# Patient Record
Sex: Male | Born: 1965 | Race: Black or African American | Hispanic: No | Marital: Single | Smoking: Never smoker
Health system: Southern US, Community
[De-identification: ages and names within clinical notes are randomized; demographics above are authoritative.]

## PROBLEM LIST (undated history)

## (undated) DIAGNOSIS — I10 Essential (primary) hypertension: Secondary | ICD-10-CM

## (undated) HISTORY — DX: Essential (primary) hypertension: I10

## (undated) HISTORY — PX: NO PAST SURGERIES: SHX2092

---

## 1998-01-25 ENCOUNTER — Emergency Department (HOSPITAL_COMMUNITY): Admission: EM | Admit: 1998-01-25 | Discharge: 1998-01-25 | Payer: Self-pay | Admitting: Emergency Medicine

## 1998-12-14 ENCOUNTER — Emergency Department (HOSPITAL_COMMUNITY): Admission: EM | Admit: 1998-12-14 | Discharge: 1998-12-14 | Payer: Self-pay | Admitting: Emergency Medicine

## 2002-09-02 ENCOUNTER — Encounter: Payer: Self-pay | Admitting: Occupational Medicine

## 2002-09-02 ENCOUNTER — Encounter: Admission: RE | Admit: 2002-09-02 | Discharge: 2002-09-02 | Payer: Self-pay | Admitting: Occupational Medicine

## 2006-03-31 ENCOUNTER — Ambulatory Visit: Payer: Self-pay | Admitting: Family Medicine

## 2010-09-02 ENCOUNTER — Ambulatory Visit (INDEPENDENT_AMBULATORY_CARE_PROVIDER_SITE_OTHER): Payer: Managed Care, Other (non HMO) | Admitting: Family Medicine

## 2010-09-02 DIAGNOSIS — L723 Sebaceous cyst: Secondary | ICD-10-CM

## 2011-02-20 ENCOUNTER — Emergency Department (HOSPITAL_COMMUNITY)
Admission: EM | Admit: 2011-02-20 | Discharge: 2011-02-20 | Disposition: A | Discharge status: Home / Self Care | Payer: Managed Care, Other (non HMO) | Attending: Emergency Medicine | Admitting: Emergency Medicine

## 2011-02-20 DIAGNOSIS — H6690 Otitis media, unspecified, unspecified ear: Secondary | ICD-10-CM | POA: Insufficient documentation

## 2011-02-20 DIAGNOSIS — N390 Urinary tract infection, site not specified: Secondary | ICD-10-CM | POA: Insufficient documentation

## 2011-02-20 DIAGNOSIS — A64 Unspecified sexually transmitted disease: Secondary | ICD-10-CM | POA: Insufficient documentation

## 2011-02-20 LAB — URINALYSIS, ROUTINE W REFLEX MICROSCOPIC
Bilirubin Urine: NEGATIVE
Glucose, UA: NEGATIVE mg/dL
Hgb urine dipstick: NEGATIVE
Nitrite: NEGATIVE
Specific Gravity, Urine: 1.025 (ref 1.005–1.030)
Urobilinogen, UA: 1 mg/dL (ref 0.0–1.0)
pH: 6 (ref 5.0–8.0)

## 2011-02-20 LAB — URINE MICROSCOPIC-ADD ON

## 2011-02-21 LAB — URINE CULTURE
Culture  Setup Time: 201209161115
Culture: NO GROWTH

## 2011-02-22 LAB — GC/CHLAMYDIA PROBE AMP, GENITAL: GC Probe Amp, Genital: NEGATIVE

## 2013-08-16 ENCOUNTER — Ambulatory Visit: Payer: No Typology Code available for payment source

## 2013-08-16 ENCOUNTER — Ambulatory Visit (INDEPENDENT_AMBULATORY_CARE_PROVIDER_SITE_OTHER): Payer: No Typology Code available for payment source | Admitting: Emergency Medicine

## 2013-08-16 ENCOUNTER — Other Ambulatory Visit: Payer: Self-pay | Admitting: Emergency Medicine

## 2013-08-16 VITALS — BP 146/92 | HR 110 | Temp 98.4°F | Resp 18 | Ht 66.5 in | Wt 141.6 lb

## 2013-08-16 DIAGNOSIS — M25569 Pain in unspecified knee: Secondary | ICD-10-CM

## 2013-08-16 LAB — POCT CBC
Granulocyte percent: 72.5 %G (ref 37–80)
HCT, POC: 43.2 % — AB (ref 43.5–53.7)
HEMOGLOBIN: 13.7 g/dL — AB (ref 14.1–18.1)
LYMPH, POC: 2 (ref 0.6–3.4)
MCH: 30.4 pg (ref 27–31.2)
MCHC: 31.7 g/dL — AB (ref 31.8–35.4)
MCV: 96 fL (ref 80–97)
MID (CBC): 0.7 (ref 0–0.9)
MPV: 7.4 fL (ref 0–99.8)
PLATELET COUNT, POC: 386 10*3/uL (ref 142–424)
POC GRANULOCYTE: 7.2 — AB (ref 2–6.9)
POC LYMPH %: 20.2 % (ref 10–50)
POC MID %: 7.3 % (ref 0–12)
RBC: 4.5 M/uL — AB (ref 4.69–6.13)
RDW, POC: 13 %
WBC: 9.9 10*3/uL (ref 4.6–10.2)

## 2013-08-16 MED ORDER — HYDROCODONE-ACETAMINOPHEN 5-325 MG PO TABS: 1.0000 | ORAL_TABLET | Freq: Four times a day (QID) | ORAL | Status: DC | PRN

## 2013-08-16 MED ORDER — COLCHICINE 0.6 MG PO TABS: 0.6000 mg | ORAL_TABLET | Freq: Two times a day (BID) | ORAL | Status: DC

## 2013-08-16 NOTE — Patient Instructions (Signed)
Gout Gout is an inflammatory arthritis caused by a buildup of uric acid crystals in the joints. Uric acid is a chemical that is normally present in the blood. When the level of uric acid in the blood is too high it can form crystals that deposit in your joints and tissues. This causes joint redness, soreness, and swelling (inflammation). Repeat attacks are common. Over time, uric acid crystals can form into masses (tophi) near a joint, destroying bone and causing disfigurement. Gout is treatable and often preventable. CAUSES  The disease begins with elevated levels of uric acid in the blood. Uric acid is produced by your body when it breaks down a naturally found substance called purines. Certain foods you eat, such as meats and fish, contain high amounts of purines. Causes of an elevated uric acid level include:  Being passed down from parent to child (heredity).  Diseases that cause increased uric acid production (such as obesity, psoriasis, and certain cancers).  Excessive alcohol use.  Diet, especially diets rich in meat and seafood.  Medicines, including certain cancer-fighting medicines (chemotherapy), water pills (diuretics), and aspirin.  Chronic kidney disease. The kidneys are no longer able to remove uric acid well.  Problems with metabolism. Conditions strongly associated with gout include:  Obesity.  High blood pressure.  High cholesterol.  Diabetes. Not everyone with elevated uric acid levels gets gout. It is not understood why some people get gout and others do not. Surgery, joint injury, and eating too much of certain foods are some of the factors that can lead to gout attacks. SYMPTOMS   An attack of gout comes on quickly. It causes intense pain with redness, swelling, and warmth in a joint.  Fever can occur.  Often, only one joint is involved. Certain joints are more commonly involved:  Base of the big toe.  Knee.  Ankle.  Wrist.  Finger. Without  treatment, an attack usually goes away in a few days to weeks. Between attacks, you usually will not have symptoms, which is different from many other forms of arthritis. DIAGNOSIS  Your caregiver will suspect gout based on your symptoms and exam. In some cases, tests may be recommended. The tests may include:  Blood tests.  Urine tests.  X-rays.  Joint fluid exam. This exam requires a needle to remove fluid from the joint (arthrocentesis). Using a microscope, gout is confirmed when uric acid crystals are seen in the joint fluid. TREATMENT  There are two phases to gout treatment: treating the sudden onset (acute) attack and preventing attacks (prophylaxis).  Treatment of an Acute Attack.  Medicines are used. These include anti-inflammatory medicines or steroid medicines.  An injection of steroid medicine into the affected joint is sometimes necessary.  The painful joint is rested. Movement can worsen the arthritis.  You may use warm or cold treatments on painful joints, depending which works best for you.  Treatment to Prevent Attacks.  If you suffer from frequent gout attacks, your caregiver may advise preventive medicine. These medicines are started after the acute attack subsides. These medicines either help your kidneys eliminate uric acid from your body or decrease your uric acid production. You may need to stay on these medicines for a very long time.  The early phase of treatment with preventive medicine can be associated with an increase in acute gout attacks. For this reason, during the first few months of treatment, your caregiver may also advise you to take medicines usually used for acute gout treatment. Be sure you   understand your caregiver's directions. Your caregiver may make several adjustments to your medicine dose before these medicines are effective.  Discuss dietary treatment with your caregiver or dietitian. Alcohol and drinks high in sugar and fructose and foods  such as meat, poultry, and seafood can increase uric acid levels. Your caregiver or dietician can advise you on drinks and foods that should be limited. HOME CARE INSTRUCTIONS   Do not take aspirin to relieve pain. This raises uric acid levels.  Only take over-the-counter or prescription medicines for pain, discomfort, or fever as directed by your caregiver.  Rest the joint as much as possible. When in bed, keep sheets and blankets off painful areas.  Keep the affected joint raised (elevated).  Apply warm or cold treatments to painful joints. Use of warm or cold treatments depends on which works best for you.  Use crutches if the painful joint is in your leg.  Drink enough fluids to keep your urine clear or pale yellow. This helps your body get rid of uric acid. Limit alcohol, sugary drinks, and fructose drinks.  Follow your dietary instructions. Pay careful attention to the amount of protein you eat. Your daily diet should emphasize fruits, vegetables, whole grains, and fat-free or low-fat milk products. Discuss the use of coffee, vitamin C, and cherries with your caregiver or dietician. These may be helpful in lowering uric acid levels.  Maintain a healthy body weight. SEEK MEDICAL CARE IF:   You develop diarrhea, vomiting, or any side effects from medicines.  You do not feel better in 24 hours, or you are getting worse. SEEK IMMEDIATE MEDICAL CARE IF:   Your joint becomes suddenly more tender, and you have chills or a fever. MAKE SURE YOU:   Understand these instructions.  Will watch your condition.  Will get help right away if you are not doing well or get worse. Document Released: 05/20/2000 Document Revised: 09/17/2012 Document Reviewed: 01/04/2012 [facility]ExitCare Patient Information 2014 [place]ExitCare, [place]. Hypertension As your heart beats, it forces blood through your arteries. This force is your blood pressure. If the pressure is too high, it is called hypertension (HTN) or high  blood pressure. HTN is dangerous because you may have it and not know it. High blood pressure may mean that your heart has to work harder to pump blood. Your arteries may be narrow or stiff. The extra work puts you at risk for heart disease, stroke, and other problems.  Blood pressure consists of two numbers, a higher number over a lower, 110/72, for example. It is stated as "110 over 72." The ideal is below 120 for the top number (systolic) and under 80 for the bottom (diastolic). Write down your blood pressure today. You should pay close attention to your blood pressure if you have certain conditions such as:  Heart failure.  Prior heart attack.  Diabetes  Chronic kidney disease.  Prior stroke.  Multiple risk factors for heart disease. To see if you have HTN, your blood pressure should be measured while you are seated with your arm held at the level of the heart. It should be measured at least twice. A one-time elevated blood pressure reading (especially in the Emergency Department) does not mean that you need treatment. There may be conditions in which the blood pressure is different between your right and left arms. It is important to see your caregiver soon for a recheck. Most people have essential hypertension which means that there is not a specific cause. This type of high  blood pressure may be lowered by changing lifestyle factors such as:  Stress.  Smoking.  Lack of exercise.  Excessive weight.  Drug/tobacco/alcohol use.  Eating less salt. Most people do not have symptoms from high blood pressure until it has caused damage to the body. Effective treatment can often prevent, delay or reduce that damage. TREATMENT  When a cause has been identified, treatment for high blood pressure is directed at the cause. There are a large number of medications to treat HTN. These fall into several categories, and your caregiver will help you select the medicines that are best for you.  Medications may have side effects. You should review side effects with your caregiver. If your blood pressure stays high after you have made lifestyle changes or started on medicines,   Your medication(s) may need to be changed.  Other problems may need to be addressed.  Be certain you understand your prescriptions, and know how and when to take your medicine.  Be sure to follow up with your caregiver within the time frame advised (usually within two weeks) to have your blood pressure rechecked and to review your medications.  If you are taking more than one medicine to lower your blood pressure, make sure you know how and at what times they should be taken. Taking two medicines at the same time can result in blood pressure that is too low. SEEK IMMEDIATE MEDICAL CARE IF:  You develop a severe headache, blurred or changing vision, or confusion.  You have unusual weakness or numbness, or a faint feeling.  You have severe chest or abdominal pain, vomiting, or breathing problems. MAKE SURE YOU:   Understand these instructions.  Will watch your condition.  Will get help right away if you are not doing well or get worse. Document Released: 05/23/2005 Document Revised: 08/15/2011 Document Reviewed: 01/11/2008 [facility]Exit[facility] Patient Information 2014 [place], [place].

## 2013-08-16 NOTE — Progress Notes (Addendum)
Subjective:    Patient ID: Juan Burton, male    DOB: December 29, 1965, 48 y.o.   MRN: [id number]  HPI Scribed for Lesle ChrisSteven Daub MD, the patient was seen in room 1. This chart was scribed by Lewanda RifeAlexandra Hurtado, ED scribe. Patient's care was started at 5:50 PM  HPI Comments: Hx was provided by the pt.  Juan Burton is a 48 y.o. male who presents to the [facility] complaining of constant right knee "soreness" onset gradual 7 days. States he is a [employer]. Describes pain as moderate in severity. Reports pain is exacerbated by ROM and mildly alleviated at rest. Denies associated other arthralgias, recent trauma/fall, chills, and fever. Denies PMHx of gout.   History reviewed. No pertinent past medical history.  History reviewed. No pertinent past surgical history.  Family History  Problem Relation Age of Onset   Diabetes Mother    Hypertension Mother     History   Social History   Marital Status: Single    Spouse Name: N/A    Number of Children: N/A   Years of Education: N/A   Occupational History   Not on file.   Social History Main Topics   Smoking status: Never Smoker    Smokeless tobacco: Not on file   Alcohol Use: No   Drug Use: No   Sexual Activity: Not on file   Other Topics Concern   Not on file   Social History Narrative   No narrative on file    No Known Allergies  There are no active problems to display for this patient.   Filed Vitals:   08/16/13 1652  BP: 146/92  Pulse: 110  Temp: 98.4 F (36.9 C)  TempSrc: Oral  Resp: 18  Height: 5' 6.5" (1.689 m)  Weight: 141 lb 9.6 oz (64.229 kg)  SpO2: 98%         Review of Systems A complete 10 system review of systems was obtained and all systems are negative except as noted in the HPI and PMHx.       Objective:   Physical Exam  Physical Exam  Vitals reviewed. Constitutional: He is oriented to person, place, and time. He appears well-developed and well-nourished.  No distress.  HENT:  Head: Normocephalic and atraumatic.  Throat: Oropharynx is clear and moist. Mucous membranes normal. Uvula is midline.  Eyes: EOM are normal.  Neck: Neck supple. No tracheal deviation present.  Cardiovascular: Normal rate.   Pulmonary/Chest: Effort normal. No respiratory distress.  Musculoskeletal:  Right knee effusion noted. Limited ROM of right knee secondary to fluid.  No instability of right knee. Not able to extend knee. Knee was warm to the touch.  Neurological: He is alert and oriented to person, place, and time.  Skin: Skin is warm and dry.  Psychiatric: He has a normal mood and affect. His behavior is normal.  The right knee was prepped with Betadine x2. The skin was numbed  with 1-1/2 cc of 2% plain. Using sterile gloves approximately 30 cc of a slightly cloudy yellow fluid was removed. Patient tolerated the procedure well. Results for orders placed in visit on 08/16/13  POCT CBC      Result Value Ref Range   WBC 9.9  4.6 - 10.2 K/uL   Lymph, poc 2.0  0.6 - 3.4   POC LYMPH PERCENT 20.2  10 - 50 %L   MID (cbc) 0.7  0 - 0.9   POC MID % 7.3  0 - 12 %  M   POC Granulocyte 7.2 (*) 2 - 6.9   Granulocyte percent 72.5  37 - 80 %G   RBC 4.50 (*) 4.69 - 6.13 M/uL   Hemoglobin 13.7 (*) 14.1 - 18.1 g/dL   HCT, POC 16.1 (*) 09.6 - 53.7 %   MCV 96.0  80 - 97 fL   MCH, POC 30.4  27 - 31.2 pg   MCHC 31.7 (*) 31.8 - 35.4 g/dL   RDW, POC 04.5     Platelet Count, POC 386  142 - 424 K/uL   MPV 7.4  0 - 99.8 fL   [facility] reading (PRIMARY) by  Dr Cleta Alberts no abnormalities seen on the films.    Assessment & Plan:  I suspect this is gout. We'll treat with colchicine 0.6 twice a day and he was given hydrocodone for severe pain along with crutches . Patient did have chlamydia 2 years ago. I did go ahead and do a URiprobe and sent his joint fluid for culture. I still suspect this is most likely gout. We'll get him on colchicine and see how that does recheck him on Wednesday sooner if  he is getting worse. I gave him information about blood pressure and gout. If his pressure still elevated on his recheck will start him on blood pressure medication . I personally performed the services described in this documentation, which was scribed in my presence. The recorded information has been reviewed and is accurate. I personally performed the services described in this documentation, which was scribed in my presence. The recorded information has been reviewed and is accurate.

## 2013-08-17 LAB — BASIC METABOLIC PANEL
BUN: 15 mg/dL (ref 6–23)
CO2: 31 meq/L (ref 19–32)
Calcium: 9.6 mg/dL (ref 8.4–10.5)
Chloride: 102 mEq/L (ref 96–112)
Creat: 1.12 mg/dL (ref 0.50–1.35)
GLUCOSE: 81 mg/dL (ref 70–99)
POTASSIUM: 4.3 meq/L (ref 3.5–5.3)
Sodium: 140 mEq/L (ref 135–145)

## 2013-08-17 LAB — URIC ACID: URIC ACID, SERUM: 6 mg/dL (ref 4.0–7.8)

## 2013-08-20 ENCOUNTER — Encounter: Payer: Self-pay | Admitting: *Deleted

## 2013-08-20 LAB — SYNOVIAL FLUID PANEL
Crystals, Fluid: NONE SEEN
EOSINOPHILS-SYNOVIAL: 0 % (ref 0–1)
Glucose, Synovial Fluid: 51 mg/dL
LYMPHOCYTES-SYNOVIAL FLD: 41 % — AB (ref 0–20)
MONOCYTE/MACROPHAGE: 14 % — AB (ref 50–90)
Neutrophil, Synovial: 45 % — ABNORMAL HIGH (ref 0–25)
Protein, Synovial Fluid: 4.7 g/dL — ABNORMAL HIGH (ref 1.0–3.0)
WBC, SYNOVIAL: 5600 uL — AB (ref 0–200)

## 2013-08-20 LAB — BODY FLUID CULTURE
Gram Stain: NONE SEEN
Organism ID, Bacteria: NO GROWTH

## 2013-08-21 ENCOUNTER — Telehealth: Payer: Self-pay

## 2013-08-21 LAB — BODY FLUID CULTURE
Gram Stain: NONE SEEN
Organism ID, Bacteria: NO GROWTH

## 2013-08-21 NOTE — Telephone Encounter (Signed)
DR.DAUB, PT STATES THAT YOU WERE SUPPOSED TO CALL HER REGARDING HER KNEE. BEST# [phone]

## 2013-08-21 NOTE — Telephone Encounter (Signed)
Lab spoke to pt.

## 2013-08-22 ENCOUNTER — Ambulatory Visit (INDEPENDENT_AMBULATORY_CARE_PROVIDER_SITE_OTHER): Payer: No Typology Code available for payment source | Admitting: Emergency Medicine

## 2013-08-22 VITALS — BP 177/99 | HR 99 | Temp 98.2°F | Resp 18 | Ht 66.5 in | Wt 144.0 lb

## 2013-08-22 DIAGNOSIS — M25561 Pain in right knee: Secondary | ICD-10-CM

## 2013-08-22 DIAGNOSIS — I10 Essential (primary) hypertension: Secondary | ICD-10-CM

## 2013-08-22 DIAGNOSIS — M25569 Pain in unspecified knee: Secondary | ICD-10-CM

## 2013-08-22 LAB — GC/CHLAMYDIA PROBE AMP
CT Probe RNA: POSITIVE — AB
GC Probe RNA: NEGATIVE

## 2013-08-22 LAB — HIV ANTIBODY (ROUTINE TESTING W REFLEX): HIV: NONREACTIVE

## 2013-08-22 LAB — RPR

## 2013-08-22 MED ORDER — AZITHROMYCIN 250 MG PO TABS: ORAL_TABLET | ORAL | Status: DC

## 2013-08-22 MED ORDER — METOPROLOL SUCCINATE ER 50 MG PO TB24: ORAL_TABLET | ORAL | Status: DC

## 2013-08-22 NOTE — Patient Instructions (Signed)
Please return to see me in 2 weeks. Take your blood pressure medication as instructed. I am making you an appointment to see the knee specialist .Hypertension As your heart beats, it forces blood through your arteries. This force is your blood pressure. If the pressure is too high, it is called hypertension (HTN) or high blood pressure. HTN is dangerous because you may have it and not know it. High blood pressure may mean that your heart has to work harder to pump blood. Your arteries may be narrow or stiff. The extra work puts you at risk for heart disease, stroke, and other problems.  Blood pressure consists of two numbers, a higher number over a lower, 110/72, for example. It is stated as "110 over 72." The ideal is below 120 for the top number (systolic) and under 80 for the bottom (diastolic). Write down your blood pressure today. You should pay close attention to your blood pressure if you have certain conditions such as:  Heart failure.  Prior heart attack.  Diabetes  Chronic kidney disease.  Prior stroke.  Multiple risk factors for heart disease. To see if you have HTN, your blood pressure should be measured while you are seated with your arm held at the level of the heart. It should be measured at least twice. A one-time elevated blood pressure reading (especially in the Emergency Department) does not mean that you need treatment. There may be conditions in which the blood pressure is different between your right and left arms. It is important to see your caregiver soon for a recheck. Most people have essential hypertension which means that there is not a specific cause. This type of high blood pressure may be lowered by changing lifestyle factors such as:  Stress.  Smoking.  Lack of exercise.  Excessive weight.  Drug/tobacco/alcohol use.  Eating less salt. Most people do not have symptoms from high blood pressure until it has caused damage to the body. Effective treatment can  often prevent, delay or reduce that damage. TREATMENT  When a cause has been identified, treatment for high blood pressure is directed at the cause. There are a large number of medications to treat HTN. These fall into several categories, and your caregiver will help you select the medicines that are best for you. Medications may have side effects. You should review side effects with your caregiver. If your blood pressure stays high after you have made lifestyle changes or started on medicines,   Your medication(s) may need to be changed.  Other problems may need to be addressed.  Be certain you understand your prescriptions, and know how and when to take your medicine.  Be sure to follow up with your caregiver within the time frame advised (usually within two weeks) to have your blood pressure rechecked and to review your medications.  If you are taking more than one medicine to lower your blood pressure, make sure you know how and at what times they should be taken. Taking two medicines at the same time can result in blood pressure that is too low. SEEK IMMEDIATE MEDICAL CARE IF:  You develop a severe headache, blurred or changing vision, or confusion.  You have unusual weakness or numbness, or a faint feeling.  You have severe chest or abdominal pain, vomiting, or breathing problems. MAKE SURE YOU:   Understand these instructions.  Will watch your condition.  Will get help right away if you are not doing well or get worse. Document Released: 05/23/2005 Document Revised:  08/15/2011 Document Reviewed: 01/11/2008 ExitCare Patient Information 2014 [place], [place].

## 2013-08-22 NOTE — Progress Notes (Signed)
Subjective:    Patient ID: Juan Burton, male    DOB: 09/03/1965, 48 y.o.   MRN: [id number]  HPI 48 year old male pt presents for a follow up on the right knee. Pt states the medication is helping. No injury to knee. His knee has never given away or become locked within the last month. He previously had a lot of inflammation in his knee. The cultures did not show any bacteria or crystals. Pt does custodial work and walks a lot. Pt's blood pressure was high last time and also high today. Pt has never been on BP medication. When he came in today his BP was 152/80; when it was rechecked it was 177/99.   Review of Systems     Objective:   Physical Exam patient is alert and cooperative not ill-appearing. His neck is supple. Chest was clear heart was an S4 gallop without S3 no murmurs abdomen is soft nontender examination of the right knee reveals persistent 2+ swelling. He is unable to reach full extension most likely secondary to the fluid on his knee. There is no joint instability there is no joint line tenderness .  Results for orders placed in visit on 08/16/13  BODY FLUID CULTURE      Result Value Ref Range   Gram Stain WBC present-predominately PMN     Gram Stain No Organisms Seen     Organism ID, Bacteria NO GROWTH 3 DAYS    BODY FLUID CULTURE      Result Value Ref Range   Gram Stain WBC present-both PMN and Mononuclear     Gram Stain No Organisms Seen     Organism ID, Bacteria NO GROWTH 3 DAYS    SYNOVIAL FLUID PANEL      Result Value Ref Range   Protein, Synovial Fluid 4.7 (*) 1.0 - 3.0 g/dL   Color, Synovial YELLOW  YELLOW   Appearance-Synovial HAZY (*) CLEAR   WBC, Synovial 5600 (*) 0 - 200 cu mm   Neutrophil, Synovial 45 (*) 0 - 25 %   Lymphocytes-Synovial Fld 41 (*) 0 - 20 %   Monocyte/Macrophage 14 (*) 50 - 90 %   Eosinophils-Synovial 0  0 - 1 %   Crystals, Fluid No crystals seen     Glucose, Synovial Fluid 51    BASIC METABOLIC PANEL      Result Value Ref Range   Sodium 140  135 - 145 mEq/L   Potassium 4.3  3.5 - 5.3 mEq/L   Chloride 102  96 - 112 mEq/L   CO2 31  19 - 32 mEq/L   Glucose, Bld 81  70 - 99 mg/dL   BUN 15  6 - 23 mg/dL   Creat 9.141.12  7.820.50 - 9.561.35 mg/dL   Calcium 9.6  8.4 - 21.310.5 mg/dL  URIC ACID      Result Value Ref Range   Uric Acid, Serum 6.0  4.0 - 7.8 mg/dL  POCT CBC      Result Value Ref Range   WBC 9.9  4.6 - 10.2 K/uL   Lymph, poc 2.0  0.6 - 3.4   POC LYMPH PERCENT 20.2  10 - 50 %L   MID (cbc) 0.7  0 - 0.9   POC MID % 7.3  0 - 12 %M   POC Granulocyte 7.2 (*) 2 - 6.9   Granulocyte percent 72.5  37 - 80 %G   RBC 4.50 (*) 4.69 - 6.13 M/uL   Hemoglobin 13.7 (*)  14.1 - 18.1 g/dL   HCT, POC 78.4 (*) 69.6 - 53.7 %   MCV 96.0  80 - 97 fL   MCH, POC 30.4  27 - 31.2 pg   MCHC 31.7 (*) 31.8 - 35.4 g/dL   RDW, POC 29.5     Platelet Count, POC 386  142 - 424 K/uL   MPV 7.4  0 - 99.8 fL   EKG borderline short PR interval      Assessment & Plan:  Not sure of the cause of his right knee swelling. It appears to be some type of inflammatory condition. Culture was negative. No crystals were seen. He has improved on colchicine. Referral made to orthopedics. He is to transition to Aleve 2 twice a day. He can return to work Monday. He was started on Lopressor 25 mg twice a day for blood pressure control recheck 1 month. His EKG did have a borderline short PR interval but he has no symptoms of tachycardia . There was no delta wave

## 2013-08-22 NOTE — Addendum Note (Signed)
Addended by: Johnnette LitterARDWELL, Marita Burnsed M on: 08/22/2013 02:44 PM   Modules accepted: Orders

## 2013-08-23 ENCOUNTER — Telehealth: Payer: Self-pay

## 2013-08-23 ENCOUNTER — Other Ambulatory Visit: Payer: Self-pay

## 2013-08-23 MED ORDER — AZITHROMYCIN 1 G PO PACK: 1.0000 g | PACK | Freq: Once | ORAL | Status: DC

## 2013-08-23 NOTE — Telephone Encounter (Signed)
Spoke with patient, patient received correct dose of antibiotic.  Advised patient not to get the 1gm that was sent in today.  Called pharmacy and cancelled Rx

## 2013-09-06 ENCOUNTER — Telehealth: Payer: Self-pay

## 2013-09-06 NOTE — Telephone Encounter (Signed)
Patient called stated Dr. Cleta Albertsaub prescribed him medication for blood pressure. Patient is requesting to change over to another medication because the medication prescribed is causing his left knee to swell. Patient pharmacy is [facility] on [address]. [place]  Contact number [phone].

## 2013-09-07 ENCOUNTER — Other Ambulatory Visit: Payer: Self-pay | Admitting: Emergency Medicine

## 2013-09-07 NOTE — Telephone Encounter (Signed)
Pt have appoinment with ortho.

## 2013-09-07 NOTE — Telephone Encounter (Signed)
Dr. Cleta Albertsaub spoke with pt earlier.

## 2013-09-07 NOTE — Telephone Encounter (Signed)
Call patient let him know he is correct. He does not have gout. The blood pressure medication he is on however does not cause knee swelling. He needs a referral to see the orthopedist regarding the problems he is having with his knee. Please put in a referral for orthopedic

## 2013-09-07 NOTE — Telephone Encounter (Signed)
His knee is swelling because he has gout. The blood pressure medication that he is on does not cause any fluid retention or swelling if he would like to come in and see me at 102 and discussed different medications for blood pressure would be happy to do that

## 2013-09-07 NOTE — Telephone Encounter (Signed)
Pt says we called him and told him that it was not gout and now he's confused. Please advise.

## 2014-02-10 ENCOUNTER — Other Ambulatory Visit: Payer: Self-pay | Admitting: Emergency Medicine

## 2014-10-31 ENCOUNTER — Other Ambulatory Visit: Payer: Self-pay | Admitting: Emergency Medicine

## 2014-12-20 ENCOUNTER — Other Ambulatory Visit: Payer: Self-pay | Admitting: Physician Assistant

## 2014-12-20 ENCOUNTER — Ambulatory Visit (INDEPENDENT_AMBULATORY_CARE_PROVIDER_SITE_OTHER): Payer: BLUE CROSS/BLUE SHIELD | Admitting: Family Medicine

## 2014-12-20 VITALS — BP 122/72 | HR 80 | Temp 98.4°F | Resp 17 | Ht 67.5 in | Wt 144.0 lb

## 2014-12-20 DIAGNOSIS — I10 Essential (primary) hypertension: Secondary | ICD-10-CM | POA: Diagnosis not present

## 2014-12-20 DIAGNOSIS — L918 Other hypertrophic disorders of the skin: Secondary | ICD-10-CM

## 2014-12-20 DIAGNOSIS — Z0000 Encounter for general adult medical examination without abnormal findings: Secondary | ICD-10-CM

## 2014-12-20 DIAGNOSIS — Z202 Contact with and (suspected) exposure to infections with a predominantly sexual mode of transmission: Secondary | ICD-10-CM

## 2014-12-20 LAB — POCT CBC
Granulocyte percent: 60 %G (ref 37–80)
HCT, POC: 44.5 % (ref 43.5–53.7)
Hemoglobin: 15.2 g/dL (ref 14.1–18.1)
Lymph, poc: 1.7 (ref 0.6–3.4)
MCH, POC: 31 pg (ref 27–31.2)
MCHC: 34.2 g/dL (ref 31.8–35.4)
MCV: 90.6 fL (ref 80–97)
MID (CBC): 0.3 (ref 0–0.9)
MPV: 6.5 fL (ref 0–99.8)
POC Granulocyte: 3 (ref 2–6.9)
POC LYMPH %: 34.4 % (ref 10–50)
POC MID %: 5.6 %M (ref 0–12)
Platelet Count, POC: 248 10*3/uL (ref 142–424)
RBC: 4.91 M/uL (ref 4.69–6.13)
RDW, POC: 12.9 %
WBC: 5 10*3/uL (ref 4.6–10.2)

## 2014-12-20 LAB — COMPREHENSIVE METABOLIC PANEL
ALK PHOS: 80 U/L (ref 39–117)
ALT: 17 U/L (ref 0–53)
AST: 17 U/L (ref 0–37)
Albumin: 4.5 g/dL (ref 3.5–5.2)
BILIRUBIN TOTAL: 0.9 mg/dL (ref 0.2–1.2)
BUN: 12 mg/dL (ref 6–23)
CO2: 29 mEq/L (ref 19–32)
Calcium: 9.7 mg/dL (ref 8.4–10.5)
Chloride: 101 mEq/L (ref 96–112)
Creat: 0.93 mg/dL (ref 0.50–1.35)
Glucose, Bld: 92 mg/dL (ref 70–99)
POTASSIUM: 4.6 meq/L (ref 3.5–5.3)
Sodium: 140 mEq/L (ref 135–145)
Total Protein: 8.1 g/dL (ref 6.0–8.3)

## 2014-12-20 LAB — IFOBT (OCCULT BLOOD): IFOBT: NEGATIVE

## 2014-12-20 LAB — LIPID PANEL
CHOL/HDL RATIO: 3.7 ratio
CHOLESTEROL: 176 mg/dL (ref 0–200)
HDL: 48 mg/dL (ref >=40)
LDL Cholesterol: 116 mg/dL — ABNORMAL HIGH (ref 0–99)
TRIGLYCERIDES: 59 mg/dL (ref <150)
VLDL: 12 mg/dL (ref 0–40)

## 2014-12-20 LAB — RPR

## 2014-12-20 LAB — HIV ANTIBODY (ROUTINE TESTING W REFLEX): HIV: NONREACTIVE

## 2014-12-20 MED ORDER — METOPROLOL SUCCINATE ER 50 MG PO TB24: ORAL_TABLET | ORAL | Status: DC

## 2014-12-20 NOTE — Progress Notes (Signed)
Hypertension Subjective:  Patient ID: Juan Burton, male    DOB: 02/11/1966  Age: 49 y.o. MRN: [id number]  Patient is here with a history of having high blood pressure and being well controlled on his Toprol medication. He needs this refilled. He has no cardiovascular or respiratory complaints.   Objective:   Healthy. Chest clear. Heart regular without murmurs. Blood pressure good control.  Assessment & Plan:   Assessment: Hypertension  Plan: There are no Patient Instructions on file for this visit.   HOPPER,DAVID, MD 12/20/2014

## 2014-12-20 NOTE — Patient Instructions (Signed)
Refill his Toprol.   Return annually.   Monitor his blood pressure work. Return at anytime if it is continuing to run high.

## 2014-12-20 NOTE — Progress Notes (Signed)
Annual physical examination  History: Patient is here for his annual physical examination which is required by the job/insurance. He has been doing well.  Past medical history: Regular medications: Toprol Allergies: None Past medical history: Hypertension, otherwise unremarkable Surgical history: Unremarkable except for childhood circumcision and some dental extractions surgeries  Family history: Mother had diabetes and hypertension, passed away 2 years ago. His father died in his 59s years ago in a motor vehicle accident. There is a family history of cancer maternal grandmother and diabetes in the maternal grandfather.  Social history: Patient does not smoke or use drugs. He does regularly drink some alcohol. He is single, no children, has had 2 or 3 sexual partners in the last year, male. He works as a Sports coach for [employer]. Has a high school education. He does get regular exercise.  Review of systems: Constitutional: Unremarkable HEENT: Unremarkable Respiratory: Unremarkable Cardiovascular: Unremarkable Gastrointestinal: Unremarkable Endocrinologic: Unremarkable Genitourinary: Unremarkable Musculoskeletal: Unremarkable Dermatologic: Unremarkable except for some little skin tags forming on his cheeks. Allergy/immunology unremarkable Neurologic: Unremarkable Hematologic: Unremarkable Psychiatric: Unremarkable   Physical examination: Healthy-appearing man in no acute distress. His TMs are normal. Eyes PERRLA. Fundi benign. Throat clear. Neck supple without nodes or thyromegaly. No carotid bruits. Chest is clear to auscultation. Heart regular without murmurs gallops or arrhythmias. Abdomen soft without mass or tenderness. Normal male external genitalia. Left spermatic cord area is larger than the right, possible varicocele, asymptomatic and no concern. Digital rectal exam reveals prostate gland to be normal in contour and size. No hemorrhage or other lesions noted.  Extremities unremarkable. Good pulses in feet. Normal except for some little black skin tags on his cheeks.  Assessment: Normal physical examination History of hypertension  Plan: Check labs including CBC, C met, lipids, HIV, RPR, URiprobe

## 2014-12-23 LAB — NICOTINE/COTININE METABOLITES

## 2014-12-24 LAB — GC/CHLAMYDIA PROBE AMP
CT Probe RNA: NEGATIVE
GC PROBE AMP APTIMA: NEGATIVE

## 2015-01-06 ENCOUNTER — Ambulatory Visit (INDEPENDENT_AMBULATORY_CARE_PROVIDER_SITE_OTHER): Payer: BLUE CROSS/BLUE SHIELD | Admitting: Family Medicine

## 2015-01-06 VITALS — BP 134/90 | HR 74 | Temp 98.6°F | Resp 18 | Ht 67.0 in | Wt 142.0 lb

## 2015-01-06 DIAGNOSIS — Z23 Encounter for immunization: Secondary | ICD-10-CM | POA: Diagnosis not present

## 2015-01-06 DIAGNOSIS — T25321A Burn of third degree of right foot, initial encounter: Secondary | ICD-10-CM

## 2015-01-06 MED ORDER — SILVER SULFADIAZINE 1 % EX CREA: TOPICAL_CREAM | CUTANEOUS | Status: DC

## 2015-01-06 NOTE — Progress Notes (Signed)
[facility] [address] [place] [address] [phone]  Date:  01/06/2015   Name:  Juan Burton   DOB:  24-Dec-1965   MRN:  [id number]  PCP:  Carollee Herter, MD    Chief Complaint: Burn   History of Present Illness:  Juan Burton is a 49 y.o. very pleasant male patient who presents with the following:  About 8 days ago he was cooking- he left something on the stove, and there was a Sports coach. He picked up the pan to put it in the sink but oil got on his right foot.   He hs not yet been seen for this.  He has been treating it at home  He is keeping it clean, applying tea tree oil  He is generally in good health  Last tetanus- he is not really sure but thinks it has been several years .  He is ok with getting a booster today Patient Active Problem List   Diagnosis Date Noted  . Unspecified essential hypertension 08/22/2013    Past Medical History  Diagnosis Date  . Hypertension     History reviewed. No pertinent past surgical history.  History  Substance Use Topics  . Smoking status: Never Smoker   . Smokeless tobacco: Not on file  . Alcohol Use: No    Family History  Problem Relation Age of Onset  . Diabetes Mother   . Hypertension Mother     No Known Allergies  Medication list has been reviewed and updated.  Current Outpatient Prescriptions on File Prior to Visit  Medication Sig Dispense Refill  . metoprolol succinate (TOPROL-XL) 50 MG 24 hr tablet Take one daily for blood pressure 90 tablet 3   No current facility-administered medications on file prior to visit.    Review of Systems:  As per HPI- otherwise negative.   Physical Examination: Filed Vitals:   01/06/15 1531  BP: 134/90  Pulse: 74  Temp: 98.6 F (37 C)  Resp: 18   Filed Vitals:   01/06/15 1531  Height:  (1.702 m)  Weight: 142 lb (64.411 kg)   Body mass index is 22.24 kg/(m^2). Ideal Body Weight: Weight in (lb) to have BMI = 25:  159.3  GEN: WDWN, NAD, Non-toxic, A & O x 3, looks well HEENT: Atraumatic, Normocephalic. Neck supple. No masses, No LAD.  Looks well Ears and Nose: No external deformity. CV: RRR, No M/G/R. No JVD. No thrill. No extra heart sounds. PULM: CTA B, no wheezes, crackles, rhonchi. No retractions. No resp. distress. No accessory muscle use. EXTR: No c/c/e NEURO Normal gait.  PSYCH: Normally interactive. Conversant. Not depressed or anxious appearing.  Calm demeanor.  The dorsum of his foot displays a 3.5 by 4.5 cm 3rd degree burn. The base is white and slightly moist. Not tender to touch but pt states it was tender at first. Tendons and bone, muscle are NOT exposed. He has it dressed and it appears clean and non- infected.  Normal DP pulse, normal cap refill and sensation of toes. He has evidence of athlete's foot with moist, white material in between toes.   We washed wound and dressed with silvadene and non- stick dressing Assessment and Plan: Third degree burn of foot, right, initial encounter - Plan: silver sulfADIAZINE (SILVADENE) 1 % cream, Tdap vaccine greater than or equal to 7yo IM  Deep partial thickness burn of the dorsum of the left foot.  It is over a week old now.  Appears to be healing well and is not infected but will need close follow-up.    Update tdap, uses silvadene and change dressing 1-2x daily.   Recheck here on Friday for evaluation  If not doing well may need referral  Signed Abbe Amsterdam, MD

## 2015-01-06 NOTE — Patient Instructions (Signed)
Use the silvadene cream on your foot once or twice a day. Change dressing once or twice a day Carefully put some antifungal cream (such as lotromin) between your toes to cure athlete's foot.  Please see me on Friday for a recheck- I will be here all day

## 2015-01-09 ENCOUNTER — Ambulatory Visit (INDEPENDENT_AMBULATORY_CARE_PROVIDER_SITE_OTHER): Payer: BLUE CROSS/BLUE SHIELD | Admitting: Family Medicine

## 2015-01-09 VITALS — BP 130/90 | HR 76 | Temp 97.6°F | Resp 16 | Ht 67.0 in | Wt 143.0 lb

## 2015-01-09 DIAGNOSIS — T25321A Burn of third degree of right foot, initial encounter: Secondary | ICD-10-CM

## 2015-01-09 NOTE — Progress Notes (Signed)
[facility] [address] [place] [address] [phone]  Date:  01/09/2015   Name:  Juan Burton   DOB:  Jan 14, 1966   MRN:  [id number]  PCP:  Carollee Herter, MD    Chief Complaint: follow up wound care   History of Present Illness:  Juan Burton is a 49 y.o. very pleasant male patient who presents with the following:  Here today to follow-up a burn on the dorsum of his right foot- seen for this on 8/2. At that time the burn was over a week old He is using silvadene  Patient Active Problem List   Diagnosis Date Noted  . Unspecified essential hypertension 08/22/2013    Past Medical History  Diagnosis Date  . Hypertension     History reviewed. No pertinent past surgical history.  History  Substance Use Topics  . Smoking status: Never Smoker   . Smokeless tobacco: Not on file  . Alcohol Use: No    Family History  Problem Relation Age of Onset  . Diabetes Mother   . Hypertension Mother     No Known Allergies  Medication list has been reviewed and updated.  Current Outpatient Prescriptions on File Prior to Visit  Medication Sig Dispense Refill  . metoprolol succinate (TOPROL-XL) 50 MG 24 hr tablet Take one daily for blood pressure 90 tablet 3  . silver sulfADIAZINE (SILVADENE) 1 % cream Apply once or twice a day to burn until healed 50 g 0   No current facility-administered medications on file prior to visit.    Review of Systems:  As per HPI- otherwise negative.   Physical Examination: Filed Vitals:   01/09/15 1637  BP: 130/90  Pulse: 76  Temp: 97.6 F (36.4 C)  Resp: 16   Filed Vitals:   01/09/15 1637  Height:  (1.702 m)  Weight: 143 lb (64.864 kg)   Body mass index is 22.39 kg/(m^2). Ideal Body Weight: Weight in (lb) to have BMI = 25: 159.3   GEN: WDWN, NAD, Non-toxic, Alert & Oriented x 3 HEENT: Atraumatic, Normocephalic.  Ears and Nose: No external deformity. EXTR: No  clubbing/cyanosis/edema NEURO: Normal gait.  PSYCH: Normally interactive. Conversant. Not depressed or anxious appearing.  Calm demeanor.  Right foot: burn remains on the dorsum of the foot- it appears to be healing.  He has increased sensation of the area and has some pain with touching the burn.  He notes that his toes feel a little stiff when he curls them.  No evidence of infection, the area is moister and no longer appears dry and tight  Assessment and Plan: Burn of foot, right, third degree, initial encounter  Dressed with silvadene again.  Encouraged him to start some stretching exercises for his ankle, foot and toes to keep the skin from healing too tightly. He will see me next week, continue dressings at home  Signed Abbe Amsterdam, MD

## 2015-01-09 NOTE — Patient Instructions (Signed)
Please come and see me next week to check on your burn Continue dressing your burn with silvadene twice a day Do some stretching exercises with your foot, ankle and toes to prevent stiffness Take care!

## 2015-01-14 ENCOUNTER — Ambulatory Visit (INDEPENDENT_AMBULATORY_CARE_PROVIDER_SITE_OTHER): Payer: BLUE CROSS/BLUE SHIELD | Admitting: Family Medicine

## 2015-01-14 VITALS — BP 148/80 | HR 64 | Temp 97.5°F | Resp 15 | Ht 67.0 in | Wt 143.0 lb

## 2015-01-14 DIAGNOSIS — T25321D Burn of third degree of right foot, subsequent encounter: Secondary | ICD-10-CM

## 2015-01-14 NOTE — Patient Instructions (Signed)
Your foot looks great!  Please come and see me next Friday the 19th for a recheck on Friday 8/19- sooner if you have any concerns  Continue using your silvadene twice a day

## 2015-01-14 NOTE — Progress Notes (Signed)
[facility] [address] [place] [address] [phone]  Date:  01/14/2015   Name:  Juan Burton   DOB:  Dec 05, 1965   MRN:  [id number]  PCP:  Carollee Herter, MD    Chief Complaint: Follow-up   History of Present Illness:  Juan Burton is a 49 y.o. very pleasant male patient who presents with the following:  Here today to recheck burn on the dorsum of his right foot- first seen for this on 8/2, which was 8 days after the burn occurred He is using silvadene and working on some stretches/ moving his toes and foot to prevent stiffness.   He feels like he is doing well, he notes normal motion of his foot and toes, the swelling of his foot is improving and he has sensation over the burned area   Patient Active Problem List   Diagnosis Date Noted  . Unspecified essential hypertension 08/22/2013    Past Medical History  Diagnosis Date  . Hypertension     No past surgical history on file.  Social History  Substance Use Topics  . Smoking status: Never Smoker   . Smokeless tobacco: None  . Alcohol Use: No    Family History  Problem Relation Age of Onset  . Diabetes Mother   . Hypertension Mother     No Known Allergies  Medication list has been reviewed and updated.  Current Outpatient Prescriptions on File Prior to Visit  Medication Sig Dispense Refill  . metoprolol succinate (TOPROL-XL) 50 MG 24 hr tablet Take one daily for blood pressure 90 tablet 3  . silver sulfADIAZINE (SILVADENE) 1 % cream Apply once or twice a day to burn until healed 50 g 0   No current facility-administered medications on file prior to visit.    Review of Systems:  As per HPI- otherwise negative.   Physical Examination: Filed Vitals:   01/14/15 1550  BP: 148/80  Pulse: 64  Temp: 97.5 F (36.4 C)  Resp: 15   Filed Vitals:   01/14/15 1550  Height:  (1.702 m)  Weight: 143 lb (64.864 kg)   Body mass index is 22.39 kg/(m^2). Ideal Body  Weight: Weight in (lb) to have BMI = 25: 159.3   GEN: WDWN, NAD, Non-toxic, Alert & Oriented x 3 HEENT: Atraumatic, Normocephalic.  Ears and Nose: No external deformity. EXTR: No clubbing/cyanosis/edema NEURO: Normal gait.  PSYCH: Normally interactive. Conversant. Not depressed or anxious appearing.  Calm demeanor.  Right foot: healing burn on dorsum of foot over the distal 2nd/3rd/4th MT and the base of these toes. He has normal motion of the toes in flexion and extension.  No evidence of infection  Assessment and Plan: Burn of right foot, third degree, subsequent encounter  Improving- we are pleased with his progress.  Encouraged him to continue stretching his toes in flexion and extension, both by moving them manually with his hands and by doing exercises with his toes.   Plan next recheck in 10 days Continue silvadene and bandaging  Signed Abbe Amsterdam, MD

## 2015-09-14 ENCOUNTER — Ambulatory Visit (INDEPENDENT_AMBULATORY_CARE_PROVIDER_SITE_OTHER): Payer: BLUE CROSS/BLUE SHIELD | Admitting: Family Medicine

## 2015-09-14 VITALS — BP 152/92 | HR 84 | Temp 98.2°F | Resp 17 | Ht 65.5 in | Wt 151.0 lb

## 2015-09-14 DIAGNOSIS — I10 Essential (primary) hypertension: Secondary | ICD-10-CM | POA: Diagnosis not present

## 2015-09-14 DIAGNOSIS — G4452 New daily persistent headache (NDPH): Secondary | ICD-10-CM

## 2015-09-14 MED ORDER — AMLODIPINE BESYLATE 5 MG PO TABS: 5.0000 mg | ORAL_TABLET | Freq: Every day | ORAL | Status: DC

## 2015-09-14 MED ORDER — METOPROLOL SUCCINATE ER 50 MG PO TB24: ORAL_TABLET | ORAL | Status: DC

## 2015-09-14 MED ORDER — BUTALBITAL-APAP-CAFFEINE 50-325-40 MG PO TABS: 1.0000 | ORAL_TABLET | Freq: Four times a day (QID) | ORAL | Status: DC | PRN

## 2015-09-14 NOTE — Progress Notes (Signed)
Patient ID: Juan Burton, male    DOB: November 17, 1965  Age: 50 y.o. MRN: [id number]  Chief Complaint  Patient presents with  . Headache  . Hypertension    Subjective:   50 year old man who's been having frequent headaches. Has not been taking anything major for them. He continues to take his metoprolol for his low pressure, but on checking the pressure at work it often runs around 160/90's. 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 However he does walk a fair amount. He is single, fixes own meals, eats a lot of snacks. Otherwise he is been healthy. He has not had a physical exam. He just turned 50.  Current allergies, medications, problem list, past/family and social histories reviewed.  Objective:  BP 152/92 mmHg  Pulse 84  Temp(Src) 98.2 F (36.8 C) (Oral)  Resp 17  Ht 5' 5.5" (1.664 m)  Wt 151 lb (68.493 kg)  BMI 24.74 kg/m2  SpO2 98%  No major distress. Chest clear. Heart regular without murmur.  Assessment & Plan:   Assessment: 1. Essential hypertension   2. New daily persistent headache       Plan: Add amlodipine for his blood pressure. Give him some fioricet for his headache.  No orders of the defined types were placed in this encounter.    Meds ordered this encounter  Medications  . metoprolol succinate (TOPROL-XL) 50 MG 24 hr tablet    Sig: Take one daily for blood pressure    Dispense:  90 tablet    Refill:  3  . amLODipine (NORVASC) 5 MG tablet    Sig: Take 1 tablet (5 mg total) by mouth daily.    Dispense:  90 tablet    Refill:  3  . butalbital-acetaminophen-caffeine (FIORICET) 50-325-40 MG tablet    Sig: Take 1-2 tablets by mouth every 6 (six) hours as needed for headache.    Dispense:  20 tablet    Refill:  0         Patient Instructions   I recommend that you do most of your reading about medical related issues on Web M.D. or the [facility] patient website  Get regular  exercise as discussed. The American Heart Association recommends 30 minutes or more at least 5 days a week  Try and get plenty of fruits and vegetables in your diet, which are the best sources of your vitamins. Always try to eat lean meat, avoiding a lot of fried and fatty foods  Continue taking the metoprolol 50 mg 1 daily  Begin taking amlodipine 5 mg daily for bedtime.  Plan to return in about 4 weeks to recheck your blood pressure. At that time we will consider getting some additional basic lab work on you. It would be good few to get a physical suture 50 years old.        IF you received an x-ray today, you will receive an invoice from [facility]. Please contact [facility] at [phone] with questions or concerns regarding your invoice.   IF you received labwork today, you will receive an invoice from [facility]. Please contact [facility] at [phone] with questions or concerns regarding your invoice.   Our billing staff will not be able to assist you with questions regarding bills from these companies.  You will be contacted with the lab results as soon as they are available. The fastest way to get your results is to  activate your My Chart account. Instructions are located on the last page of this paperwork. If you have not heard from [place] regarding the results in 2 weeks, please contact this office.          Return in about 4 weeks (around 10/12/2015).   Zakhari Fogel, MD 09/14/2015

## 2015-09-14 NOTE — Patient Instructions (Addendum)
I recommend that you do most of your reading about medical related issues on Web M.D. or the [facility] patient website  Get regular exercise as discussed. The American Heart Association recommends 30 minutes or more at least 5 days a week  Try and get plenty of fruits and vegetables in your diet, which are the best sources of your vitamins. Always try to eat lean meat, avoiding a lot of fried and fatty foods  Continue taking the metoprolol 50 mg 1 daily  Begin taking amlodipine 5 mg daily for bedtime.  Plan to return in about 4 weeks to recheck your blood pressure. At that time we will consider getting some additional basic lab work on you. It would be good few to get a physical suture 50 years old.        IF you received an x-ray today, you will receive an invoice from [facility]. Please contact [facility] at [phone] with questions or concerns regarding your invoice.   IF you received labwork today, you will receive an invoice from [facility]. Please contact [facility] at [phone] with questions or concerns regarding your invoice.   Our billing staff will not be able to assist you with questions regarding bills from these companies.  You will be contacted with the lab results as soon as they are available. The fastest way to get your results is to activate your My Chart account. Instructions are located on the last page of this paperwork. If you have not heard from us regarding the results in 2 weeks, please contact this office.

## 2016-02-10 ENCOUNTER — Observation Stay (HOSPITAL_COMMUNITY)
Admission: EM | Admit: 2016-02-10 | Discharge: 2016-02-12 | Disposition: A | Discharge status: Home / Self Care | Payer: BLUE CROSS/BLUE SHIELD | Attending: Family Medicine | Admitting: Family Medicine

## 2016-02-10 ENCOUNTER — Emergency Department (HOSPITAL_COMMUNITY): Payer: BLUE CROSS/BLUE SHIELD

## 2016-02-10 ENCOUNTER — Encounter (HOSPITAL_COMMUNITY): Payer: Self-pay | Admitting: Emergency Medicine

## 2016-02-10 DIAGNOSIS — Z7982 Long term (current) use of aspirin: Secondary | ICD-10-CM | POA: Diagnosis not present

## 2016-02-10 DIAGNOSIS — R072 Precordial pain: Secondary | ICD-10-CM | POA: Diagnosis not present

## 2016-02-10 DIAGNOSIS — R0789 Other chest pain: Principal | ICD-10-CM | POA: Insufficient documentation

## 2016-02-10 DIAGNOSIS — I10 Essential (primary) hypertension: Secondary | ICD-10-CM | POA: Diagnosis not present

## 2016-02-10 DIAGNOSIS — R079 Chest pain, unspecified: Secondary | ICD-10-CM | POA: Diagnosis present

## 2016-02-10 LAB — CBC
HCT: 42.8 % (ref 39.0–52.0)
HEMOGLOBIN: 15 g/dL (ref 13.0–17.0)
MCH: 32.3 pg (ref 26.0–34.0)
MCHC: 35 g/dL (ref 30.0–36.0)
MCV: 92.2 fL (ref 78.0–100.0)
Platelets: 211 10*3/uL (ref 150–400)
RBC: 4.64 MIL/uL (ref 4.22–5.81)
RDW: 12.1 % (ref 11.5–15.5)
WBC: 5.7 10*3/uL (ref 4.0–10.5)

## 2016-02-10 LAB — TSH: TSH: 1.075 u[IU]/mL (ref 0.350–4.500)

## 2016-02-10 LAB — BASIC METABOLIC PANEL
ANION GAP: 6 (ref 5–15)
BUN: 12 mg/dL (ref 6–20)
CALCIUM: 9.5 mg/dL (ref 8.9–10.3)
CHLORIDE: 104 mmol/L (ref 101–111)
CO2: 31 mmol/L (ref 22–32)
Creatinine, Ser: 1.07 mg/dL (ref 0.61–1.24)
GFR calc non Af Amer: >60 mL/min (ref >60)
Glucose, Bld: 103 mg/dL — ABNORMAL HIGH (ref 65–99)
Potassium: 4.1 mmol/L (ref 3.5–5.1)
Sodium: 141 mmol/L (ref 135–145)

## 2016-02-10 LAB — I-STAT TROPONIN, ED
TROPONIN I, POC: 0 ng/mL (ref 0.00–0.08)
Troponin i, poc: 0 ng/mL (ref 0.00–0.08)

## 2016-02-10 LAB — TROPONIN I

## 2016-02-10 MED ORDER — HEPARIN SODIUM (PORCINE) 5000 UNIT/ML IJ SOLN
5000.0000 [IU] | Freq: Three times a day (TID) | INTRAMUSCULAR | Status: DC
  Administered 2016-02-10 – 2016-02-12 (×5): 5000 [IU] via SUBCUTANEOUS
  Filled 2016-02-10 (×4): qty 1

## 2016-02-10 MED ORDER — SODIUM CHLORIDE 0.9 % IV SOLN: 250.0000 mL | INTRAVENOUS | Status: DC | PRN

## 2016-02-10 MED ORDER — SODIUM CHLORIDE 0.9% FLUSH: 3.0000 mL | INTRAVENOUS | Status: DC | PRN

## 2016-02-10 MED ORDER — ONDANSETRON HCL 4 MG/2ML IJ SOLN: 4.0000 mg | Freq: Four times a day (QID) | INTRAMUSCULAR | Status: DC | PRN

## 2016-02-10 MED ORDER — METOPROLOL SUCCINATE ER 50 MG PO TB24
50.0000 mg | ORAL_TABLET | Freq: Every day | ORAL | Status: DC
  Administered 2016-02-10: 50 mg via ORAL
  Filled 2016-02-10: qty 1

## 2016-02-10 MED ORDER — AMLODIPINE BESYLATE 5 MG PO TABS
5.0000 mg | ORAL_TABLET | Freq: Every day | ORAL | Status: DC
  Administered 2016-02-10 – 2016-02-11 (×2): 5 mg via ORAL
  Filled 2016-02-10 (×2): qty 1

## 2016-02-10 MED ORDER — ASPIRIN EC 81 MG PO TBEC
81.0000 mg | DELAYED_RELEASE_TABLET | Freq: Every day | ORAL | Status: DC
  Administered 2016-02-11 – 2016-02-12 (×2): 81 mg via ORAL
  Filled 2016-02-10 (×2): qty 1

## 2016-02-10 MED ORDER — NITROGLYCERIN 0.4 MG SL SUBL: 0.4000 mg | SUBLINGUAL_TABLET | SUBLINGUAL | Status: DC | PRN

## 2016-02-10 MED ORDER — ACETAMINOPHEN 325 MG PO TABS: 650.0000 mg | ORAL_TABLET | ORAL | Status: DC | PRN

## 2016-02-10 MED ORDER — SODIUM CHLORIDE 0.9% FLUSH
3.0000 mL | Freq: Two times a day (BID) | INTRAVENOUS | Status: DC
  Administered 2016-02-10 – 2016-02-11 (×3): 3 mL via INTRAVENOUS

## 2016-02-10 NOTE — ED Notes (Signed)
Attempted report x1. 

## 2016-02-10 NOTE — H&P (Signed)
History and Physical    Juan Burton ZOX:[id number] DOB: 1965-11-02 DOA: 02/10/2016  PCP: No PCP Per Patient Patient coming from: Work, home  Chief Complaint: Chest pain  HPI: Juan Burton is a 50 y.o. male with medical history significant of hypertension. Patient presents the emergency department via EMS for an episode of left-sided chest pain. Patient reports that he was at work and while walking towards her gait started having some severe pressure-like substernal/left sided chest pain without radiation. He had no associated diaphoresis or palpitations. His coworker saw him and sat down with him. Pain continued to be persistent even while resting. When EMS arrived, patient received full dose aspirin which relieved the pain. He did not receive nitroglycerin.  ED Course: EKG significant for sinus rhythm, LVH.  Review of Systems: As per HPI otherwise 10 point review of systems negative.  Review of Systems  Constitutional: Negative for chills, diaphoresis and fever.  Eyes: Negative for blurred vision.  Respiratory: Negative for cough, shortness of breath and wheezing.   Cardiovascular: Positive for chest pain. Negative for palpitations and orthopnea.  Gastrointestinal: Negative for abdominal pain, heartburn, nausea and vomiting.  Neurological: Negative for loss of consciousness and weakness.    Past Medical History:  Diagnosis Date  . Hypertension     History reviewed. No pertinent surgical history.   reports that he has never smoked. He has never used smokeless tobacco. He reports that he does not drink alcohol or use drugs.  No Known Allergies  Family History  Problem Relation Age of Onset  . Diabetes Mother   . Hypertension Mother     Prior to Admission medications   Medication Sig Start Date End Date Taking? Authorizing Provider  amLODipine (NORVASC) 5 MG tablet Take 1 tablet (5 mg total) by mouth daily. 09/14/15  Yes Juan Najjar, MD  metoprolol succinate (TOPROL-XL)  50 MG 24 hr tablet Take one daily for blood pressure 09/14/15  Yes Juan Najjar, MD  butalbital-acetaminophen-caffeine (FIORICET) [phone] MG tablet Take 1-2 tablets by mouth every 6 (six) hours as needed for headache. Patient not taking: Reported on 02/10/2016 09/14/15 09/13/16  Juan Najjar, MD  silver sulfADIAZINE (SILVADENE) 1 % cream Apply once or twice a day to burn until healed Patient not taking: Reported on 09/14/2015 01/06/15   Pearline Cables, MD    Physical Exam: Vitals:   02/10/16 1530 02/10/16 1600 02/10/16 1700 02/10/16 1730  BP: 170/93 154/88 162/98 156/89  Pulse: 74 73 77 69  Resp: 16 14 20 20   Temp:      TempSrc:      SpO2: 100% 100% 100% 99%  Weight:      Height:          Constitutional: NAD, calm, comfortable Vitals:   02/10/16 1530 02/10/16 1600 02/10/16 1700 02/10/16 1730  BP: 170/93 154/88 162/98 156/89  Pulse: 74 73 77 69  Resp: 16 14 20 20   Temp:      TempSrc:      SpO2: 100% 100% 100% 99%  Weight:      Height:       Eyes: PERRL, lids and conjunctivae normal ENMT: Mucous membranes are moist. Posterior pharynx clear of any exudate or lesions. Normal dentition.  Neck: normal, supple, no masses, no thyromegaly Respiratory: clear to auscultation bilaterally, no wheezing, no crackles. Normal respiratory effort. No accessory muscle use.  Cardiovascular: Regular rate and rhythm, no murmurs / rubs / gallops. No extremity edema. 2+ pedal pulses. No carotid bruits.  Abdomen: no tenderness, no masses palpated. No hepatosplenomegaly. Bowel sounds positive.  Musculoskeletal: no clubbing / cyanosis. No joint deformity upper and lower extremities. Good ROM, no contractures. Normal muscle tone.  Skin: no rashes, lesions, ulcers. No induration Neurologic: CN 2-12 grossly intact. Sensation intact. Strength 5/5 in all 4.  Psychiatric: Normal judgment and insight. Alert and oriented x 3. Normal mood.   Labs on Admission: I have personally reviewed following labs and  imaging studies  CBC:  Recent Labs Lab 02/10/16 1047  WBC 5.7  HGB 15.0  HCT 42.8  MCV 92.2  PLT 211   Basic Metabolic Panel:  Recent Labs Lab 02/10/16 1047  NA 141  K 4.1  CL 104  CO2 31  GLUCOSE 103*  BUN 12  CREATININE 1.07  CALCIUM 9.5   GFR: Estimated Creatinine Clearance: 74.5 mL/min (by C-G formula based on SCr of 1.07 mg/dL). Liver Function Tests: No results for input(s): AST, ALT, ALKPHOS, BILITOT, PROT, ALBUMIN in the last 168 hours. No results for input(s): LIPASE, AMYLASE in the last 168 hours. No results for input(s): AMMONIA in the last 168 hours. Coagulation Profile: No results for input(s): INR, PROTIME in the last 168 hours. Cardiac Enzymes: No results for input(s): CKTOTAL, CKMB, CKMBINDEX, TROPONINI in the last 168 hours. BNP (last 3 results) No results for input(s): PROBNP in the last 8760 hours. HbA1C: No results for input(s): HGBA1C in the last 72 hours. CBG: No results for input(s): GLUCAP in the last 168 hours. Lipid Profile: No results for input(s): CHOL, HDL, LDLCALC, TRIG, CHOLHDL, LDLDIRECT in the last 72 hours. Thyroid Function Tests: No results for input(s): TSH, T4TOTAL, FREET4, T3FREE, THYROIDAB in the last 72 hours. Anemia Panel: No results for input(s): VITAMINB12, FOLATE, FERRITIN, TIBC, IRON, RETICCTPCT in the last 72 hours. Urine analysis:    Component Value Date/Time   COLORURINE YELLOW 02/20/2011 0643   APPEARANCEUR CLOUDY (A) 02/20/2011 0643   LABSPEC 1.025 02/20/2011 0643   PHURINE 6.0 02/20/2011 0643   GLUCOSEU NEGATIVE 02/20/2011 0643   HGBUR NEGATIVE 02/20/2011 0643   BILIRUBINUR NEGATIVE 02/20/2011 0643   KETONESUR TRACE (A) 02/20/2011 0643   PROTEINUR NEGATIVE 02/20/2011 0643   UROBILINOGEN 1.0 02/20/2011 0643   NITRITE NEGATIVE 02/20/2011 0643   LEUKOCYTESUR LARGE (A) 02/20/2011 0643   Sepsis Labs: !!!!!!!!!!!!!!!!!!!!!!!!!!!!!!!!!!!!!!!!!!!! @LABRCNTIP (procalcitonin:4,lacticidven:4) )No results found  for this or any previous visit (from the past 240 hour(s)).   Radiological Exams on Admission: Dg Chest 2 View  Result Date: 02/10/2016 CLINICAL DATA:  Chest pain. EXAM: CHEST  2 VIEW COMPARISON:  No recent prior. FINDINGS: Mediastinum and hilar structures normal. Low lung volumes with mild basilar atelectasis. No pleural effusion pneumothorax. Heart size normal. No acute bony abnormality . IMPRESSION: Low lung volumes with mild bibasilar atelectasis. Electronically Signed   By: Maisie Fushomas  Register   On: 02/10/2016 12:09    EKG: Independently reviewed. LVH present. Sinus rhythm.  Assessment/Plan Principal Problem:   Chest pain Active Problems:   Essential hypertension   Chest pain Sounds somewhat typical. Risk factors include LVH, hypertension, age, slightly suspicious history. Heart score is 4. Chest pain is resolved. -Place in telemetry bed for observation overnight -Cycle troponin -EKG in the morning -TSH, A1c, fasting lipid panel -Echocardiogram -Consult cardiology in the morning  Essential Hypertension Uncontrolled. patient was taken medications today -Resume home amlodipine and metoprolol  DVT prophylaxis:  Heparin subcutaneous Code Status:  Full code Family Communication:  None at bedside Disposition Plan:  Likely discharge home tomorrow Consults called:  Recommend calling consult for cardiology in the morning Admission status:  Observation, telemetry   Jacquelin Hawking MD [facility] Pager [phone]  If 7PM-7AM, please contact night-coverage [email or web address] Password [facility]  02/10/2016, 6:16 PM

## 2016-02-10 NOTE — ED Triage Notes (Signed)
Pt sts left sided CP starting today; pt denies SOB

## 2016-02-10 NOTE — ED Notes (Signed)
Pt. Ambulatory to bathroom with no complaints of pain/dizziness. Steady gait.

## 2016-02-10 NOTE — ED Notes (Signed)
Pt. Given turkey sandwich and sprite. 

## 2016-02-10 NOTE — ED Notes (Addendum)
Pt. Denies chest pain on arrival to ED. Denies associated symptoms with episode of CP this AM. Pt. AxO x4, ambulatory with steady gait.

## 2016-02-10 NOTE — ED Provider Notes (Signed)
[facility]-EMERGENCY DEPT Provider Note   CSN: [id number] Arrival date & time: 02/10/16  1035     History   Chief Complaint Chief Complaint  Patient presents with  . Chest Pain    HPI Marcelle Zeek is a 50 y.o. male.  Joven Tippin is a 50 y.o. male with history of HTN presents to ED with complaint of chest pain. Patient reports onset of exertional chest pain at approximately 9:30am following cleaning a bathroom. Pain was left sided, non-radiating, 6-7, described as a cramping sensation. He had associated shortness of breath and worsening pain with deep inspiration. He chewed a baby ASA with relief of sxs. He denies fever, URI sxs, cough, hemoptysis, wheezing, palpitations, lower leg swelling, lightheadedness, dizziness, diaphoresis, chills, syncope, numbness, or weakness. No trauma to chest wall. Recent long distance travel/surgery/hospitalization, h/o blood clot, or h/o cancer.  No cardiac history. Family history of grandfather passing from CHF.       Past Medical History:  Diagnosis Date  . Hypertension     Patient Active Problem List   Diagnosis Date Noted  . Chest pain 02/10/2016  . Unspecified essential hypertension 08/22/2013    History reviewed. No pertinent surgical history.     Home Medications    Prior to Admission medications   Medication Sig Start Date End Date Taking? Authorizing Provider  amLODipine (NORVASC) 5 MG tablet Take 1 tablet (5 mg total) by mouth daily. 09/14/15  Yes Peyton Najjar, MD  metoprolol succinate (TOPROL-XL) 50 MG 24 hr tablet Take one daily for blood pressure 09/14/15  Yes Peyton Najjar, MD  butalbital-acetaminophen-caffeine (FIORICET) [phone] MG tablet Take 1-2 tablets by mouth every 6 (six) hours as needed for headache. Patient not taking: Reported on 02/10/2016 09/14/15 09/13/16  Peyton Najjar, MD  silver sulfADIAZINE (SILVADENE) 1 % cream Apply once or twice a day to burn until healed Patient not taking: Reported on 09/14/2015 01/06/15    Pearline Cables, MD    Family History Family History  Problem Relation Age of Onset  . Diabetes Mother   . Hypertension Mother     Social History Social History  Substance Use Topics  . Smoking status: Never Smoker  . Smokeless tobacco: Not on file  . Alcohol use No     Allergies   Review of patient's allergies indicates no known allergies.   Review of Systems Review of Systems  Constitutional: Negative for chills, diaphoresis and fever.  HENT: Negative for trouble swallowing.   Eyes: Negative for visual disturbance.  Respiratory: Positive for shortness of breath. Negative for cough.   Cardiovascular: Positive for chest pain. Negative for palpitations and leg swelling.  Gastrointestinal: Negative for abdominal pain, blood in stool, constipation, diarrhea, nausea and vomiting.  Genitourinary: Negative for dysuria and hematuria.  Musculoskeletal: Negative for neck pain.  Skin: Negative for rash.  Neurological: Negative for dizziness, syncope, weakness, light-headedness and numbness.     Physical Exam Updated Vital Signs BP 159/94   Pulse 72   Temp 98.1 F (36.7 C) (Oral)   Resp 18   Ht 5\' 6"  (1.676 m)   Wt 68 kg   SpO2 98%   BMI 24.21 kg/m   Physical Exam  Constitutional: He appears well-developed and well-nourished. No distress.  HENT:  Head: Normocephalic and atraumatic.  Mouth/Throat: Oropharynx is clear and moist. No oropharyngeal exudate.  Eyes: Conjunctivae and EOM are normal. Pupils are equal, round, and reactive to light. Right eye exhibits no discharge. Left eye exhibits no discharge.  No scleral icterus.  Neck: Normal range of motion and phonation normal. Neck supple. No neck rigidity. Normal range of motion present.  Cardiovascular: Normal rate, regular rhythm, normal heart sounds and intact distal pulses.   No murmur heard. Pulmonary/Chest: Effort normal and breath sounds normal. No stridor. No respiratory distress. He has no wheezes. He has no  rales.  Abdominal: Soft. Bowel sounds are normal. He exhibits no distension. There is no tenderness. There is no rigidity, no rebound, no guarding and no CVA tenderness.  Musculoskeletal: Normal range of motion.  Lymphadenopathy:    He has no cervical adenopathy.  Neurological: He is alert. He is not disoriented. Coordination and gait normal. GCS eye subscore is 4. GCS verbal subscore is 5. GCS motor subscore is 6.  Skin: Skin is warm and dry. He is not diaphoretic.  Psychiatric: He has a normal mood and affect. His behavior is normal.     ED Treatments / Results  Labs (all labs ordered are listed, but only abnormal results are displayed) Labs Reviewed  BASIC METABOLIC PANEL - Abnormal; Notable for the following:       Result Value   Glucose, Bld 103 (*)    All other components within normal limits  CBC  I-STAT TROPOININ, ED  I-STAT TROPOININ, ED    EKG  EKG Interpretation  Date/Time:  Wednesday February 10 2016 10:40:55 EDT Ventricular Rate:  79 PR Interval:  124 QRS Duration: 88 QT Interval:  392 QTC Calculation: 449 R Axis:   89 Text Interpretation:  Normal sinus rhythm Right atrial enlargement Moderate voltage criteria for LVH, may be normal variant Borderline ECG No previous ECGs available Confirmed by ZACKOWSKI  MD, SCOTT ([id number]) on 02/10/2016 12:03:07 PM       Radiology Dg Chest 2 View  Result Date: 02/10/2016 CLINICAL DATA:  Chest pain. EXAM: CHEST  2 VIEW COMPARISON:  No recent prior. FINDINGS: Mediastinum and hilar structures normal. Low lung volumes with mild basilar atelectasis. No pleural effusion pneumothorax. Heart size normal. No acute bony abnormality . IMPRESSION: Low lung volumes with mild bibasilar atelectasis. Electronically Signed   By: Maisie Fus  Register   On: 02/10/2016 12:09    Procedures Procedures (including critical care time)  Medications Ordered in ED Medications - No data to display   Initial Impression / Assessment and Plan / ED Course    I have reviewed the triage vital signs and the nursing notes.  Pertinent labs & imaging results that were available during my care of the patient were reviewed by me and considered in my medical decision making (see chart for details).  Clinical Course  Value Comment By Time  DG Chest 2 View Normal cardiac silhouette. No evidence of consolidation, effusion, or PTX. No free air under diaphragm.  Lona Kettle, [place] 09/06 1703    Patient presents to ED with chest pain. Patient is afebrile and non-toxic appearing in NAD. Vital signs remarkable for elevated BP (patient reports he did not take his BP medication), otherwise stable. Physical exam re-assuring.   Labs are re-assuring. CXR negative for acute cardiopulmonary process. Initial troponin negative. EKG shows sinus rhythm with right atrial enlargement and LVH. Heart score 4. Repeat troponin negative. Based on heart score, will consult hospitalist for admission for ACS r/o.   5:02 PM: Spoke with Dr. Mal Misty, greatly appreciated his time and input. Agree to admit patient for ACS r/o.    Final Clinical Impressions(s) / ED Diagnoses   Final diagnoses:  Chest pain, unspecified  chest pain type    New Prescriptions New Prescriptions   No medications on file     Lona Kettleshley Laurel Rekha Hobbins, PA-C 02/10/16 1704    Vanetta MuldersScott Zackowski, MD 02/11/16 [phone]

## 2016-02-11 ENCOUNTER — Observation Stay (HOSPITAL_COMMUNITY): Payer: BLUE CROSS/BLUE SHIELD

## 2016-02-11 ENCOUNTER — Observation Stay (HOSPITAL_BASED_OUTPATIENT_CLINIC_OR_DEPARTMENT_OTHER): Payer: BLUE CROSS/BLUE SHIELD

## 2016-02-11 DIAGNOSIS — R079 Chest pain, unspecified: Secondary | ICD-10-CM

## 2016-02-11 DIAGNOSIS — R0789 Other chest pain: Secondary | ICD-10-CM | POA: Diagnosis not present

## 2016-02-11 DIAGNOSIS — I10 Essential (primary) hypertension: Secondary | ICD-10-CM | POA: Diagnosis not present

## 2016-02-11 DIAGNOSIS — Z7982 Long term (current) use of aspirin: Secondary | ICD-10-CM | POA: Diagnosis not present

## 2016-02-11 LAB — ECHOCARDIOGRAM COMPLETE
HEIGHTINCHES: 66 in
WEIGHTICAEL: 2400 [oz_av]

## 2016-02-11 LAB — LIPID PANEL
CHOLESTEROL: 192 mg/dL (ref 0–200)
HDL: 38 mg/dL — AB (ref >40)
LDL CALC: 136 mg/dL — AB (ref 0–99)
TRIGLYCERIDES: 90 mg/dL (ref <150)
Total CHOL/HDL Ratio: 5.1 RATIO
VLDL: 18 mg/dL (ref 0–40)

## 2016-02-11 LAB — TROPONIN I
Troponin I: <0.03 ng/mL (ref <0.03)
Troponin I: <0.03 ng/mL (ref <0.03)

## 2016-02-11 LAB — HEMOGLOBIN A1C
Hgb A1c MFr Bld: 5.2 % (ref 4.8–5.6)
MEAN PLASMA GLUCOSE: 103 mg/dL

## 2016-02-11 MED ORDER — IOPAMIDOL (ISOVUE-370) INJECTION 76%
INTRAVENOUS | Status: AC
  Administered 2016-02-11: 80 mL via INTRAVENOUS
  Filled 2016-02-11: qty 100

## 2016-02-11 MED ORDER — NITROGLYCERIN 0.4 MG SL SUBL
SUBLINGUAL_TABLET | SUBLINGUAL | Status: AC
  Filled 2016-02-11: qty 2

## 2016-02-11 MED ORDER — NITROGLYCERIN 0.4 MG SL SUBL
0.8000 mg | SUBLINGUAL_TABLET | Freq: Once | SUBLINGUAL | Status: AC
  Administered 2016-02-11: 0.8 mg via SUBLINGUAL

## 2016-02-11 MED ORDER — METOPROLOL TARTRATE 25 MG PO TABS
25.0000 mg | ORAL_TABLET | Freq: Once | ORAL | Status: AC
  Administered 2016-02-11: 25 mg via ORAL
  Filled 2016-02-11: qty 1

## 2016-02-11 NOTE — Progress Notes (Signed)
PROGRESS NOTE    Juan Burton  ZOX:[id number] DOB: Apr 17, 1966 DOA: 02/10/2016 PCP: No PCP Per Patient    Brief Narrative:  Juan Burton is a 50 y.o. male with PMHx  of hypertension. Patient presented to the ED for an episode of left-sided chest pain. Initially with elevated Bp, no compliace with medications. Placed in obs for CP r/o ASC. BP better control. TNI negative x 3 and no EKG changes. Cardio consulted, Coronary CT ordered. Patient asymptomatic today.    Assessment & Plan:   Principal Problem:   Chest pain Active Problems:   Essential hypertension   Chest pain Sounds somewhat typical. Risk factors include LVH, hypertension, age, slightly suspicious history.Heart score 4 Chest pain is resolved. TNIs negative x 3, EKG no sig changes  - Cardio consult called - verbal recommendations Coronary Ct, - ordered, will follow - If Coronary CT negative may be discharge home with outpatient follow up - Will start statin given elevated BP and possible ACS event - Lipitor 40mg  qHs  Essential Hypertension Improved after resuming medications  - Continue Amlodipine and Metoprolol    DVT prophylaxis:  Heparin subcutaneous Code Status:  Full code Family Communication:  None at bedside Disposition Plan:  If clear by cardio can go home today.   Consultants:   Cardiology consulted     Antimicrobials: none    Subjective:   Patient seen and examined with sister on room at bedside. Patient report no complaints this morning, feeling much better.   Objective: Vitals:   02/10/16 1855 02/10/16 1952 02/10/16 2123 02/11/16 0437  BP: (!) 175/96 (!) 171/90 (!) 149/87 (!) 141/91  Pulse: 74 80 69 70  Resp: 18 20  20   Temp: 97.6 F (36.4 C) 97.8 F (36.6 C)  98 F (36.7 C)  TempSrc: Oral Oral  Oral  SpO2: 100% 98%  99%  Weight:      Height:        Intake/Output Summary (Last 24 hours) at 02/11/16 1003 Last data filed at 02/11/16 0906  Gross per 24 hour  Intake                0  ml  Output                0 ml  Net                0 ml   Filed Weights   02/10/16 1044  Weight: 68 kg (150 lb)    Examination:  General exam: Appears calm and comfortable  Respiratory system: Clear to auscultation. Respiratory effort normal. Cardiovascular system: S1 & S2 heard, RRR. No JVD, murmurs, rubs, gallops or clicks. No pedal edema. Gastrointestinal system: Abdomen is nondistended, soft and nontender. No organomegaly or masses felt. Normal bowel sounds heard. Central nervous system: Alert and oriented. No focal neurological deficits   Data Reviewed: I have personally reviewed following labs and imaging studies  CBC:  Recent Labs Lab 02/10/16 1047  WBC 5.7  HGB 15.0  HCT 42.8  MCV 92.2  PLT 211   Basic Metabolic Panel:  Recent Labs Lab 02/10/16 1047  NA 141  K 4.1  CL 104  CO2 31  GLUCOSE 103*  BUN 12  CREATININE 1.07  CALCIUM 9.5   GFR: Estimated Creatinine Clearance: 74.5 mL/min (by C-G formula based on SCr of 1.07 mg/dL). Liver Function Tests: No results for input(s): AST, ALT, ALKPHOS, BILITOT, PROT, ALBUMIN in the last 168 hours. No results for input(s): LIPASE, AMYLASE  in the last 168 hours. No results for input(s): AMMONIA in the last 168 hours. Coagulation Profile: No results for input(s): INR, PROTIME in the last 168 hours. Cardiac Enzymes:  Recent Labs Lab 02/10/16 1906 02/11/16 0046 02/11/16 0641  TROPONINI <0.03 <0.03 <0.03   BNP (last 3 results) No results for input(s): PROBNP in the last 8760 hours. HbA1C:  Recent Labs  02/10/16 1906  HGBA1C 5.2   CBG: No results for input(s): GLUCAP in the last 168 hours. Lipid Profile:  Recent Labs  02/11/16 0641  CHOL 192  HDL 38*  LDLCALC 136*  TRIG 90  CHOLHDL 5.1   Thyroid Function Tests:  Recent Labs  02/10/16 1906  TSH 1.075   Anemia Panel: No results for input(s): VITAMINB12, FOLATE, FERRITIN, TIBC, IRON, RETICCTPCT in the last 72 hours. Sepsis Labs: No  results for input(s): PROCALCITON, LATICACIDVEN in the last 168 hours.  No results found for this or any previous visit (from the past 240 hour(s)).       Radiology Studies: Dg Chest 2 View  Result Date: 02/10/2016 CLINICAL DATA:  Chest pain. EXAM: CHEST  2 VIEW COMPARISON:  No recent prior. FINDINGS: Mediastinum and hilar structures normal. Low lung volumes with mild basilar atelectasis. No pleural effusion pneumothorax. Heart size normal. No acute bony abnormality . IMPRESSION: Low lung volumes with mild bibasilar atelectasis. Electronically Signed   By: Maisie Fushomas  Register   On: 02/10/2016 12:09        Scheduled Meds: . amLODipine  5 mg Oral Daily  . aspirin EC  81 mg Oral Daily  . heparin  5,000 Units Subcutaneous Q8H  . metoprolol tartrate  25 mg Oral Once  . sodium chloride flush  3 mL Intravenous Q12H   Continuous Infusions:    LOS: 0 days    Time spent: 25 min   Lenox PondsEdwin Silva Zapata, MD [facility] Pager [phone]  If 7PM-7AM, please contact night-coverage [email or web address] Password [email or web address] 02/11/2016, 10:03 AM

## 2016-02-11 NOTE — Progress Notes (Signed)
  Echocardiogram 2D Echocardiogram has been performed.  Arvil ChacoFoster, Khilynn Borntreger 02/11/2016, 12:16 PM

## 2016-02-12 DIAGNOSIS — R079 Chest pain, unspecified: Secondary | ICD-10-CM | POA: Diagnosis not present

## 2016-02-12 DIAGNOSIS — R0789 Other chest pain: Secondary | ICD-10-CM | POA: Diagnosis not present

## 2016-02-12 DIAGNOSIS — Z7982 Long term (current) use of aspirin: Secondary | ICD-10-CM | POA: Diagnosis not present

## 2016-02-12 DIAGNOSIS — I10 Essential (primary) hypertension: Secondary | ICD-10-CM | POA: Diagnosis not present

## 2016-02-12 MED ORDER — AMLODIPINE BESYLATE 10 MG PO TABS: 10.0000 mg | ORAL_TABLET | Freq: Every day | ORAL | 0 refills | Status: DC

## 2016-02-12 MED ORDER — ASPIRIN 81 MG PO TBEC: 81.0000 mg | DELAYED_RELEASE_TABLET | Freq: Every day | ORAL | 0 refills | Status: DC

## 2016-02-12 MED ORDER — AMLODIPINE BESYLATE 10 MG PO TABS
10.0000 mg | ORAL_TABLET | Freq: Every day | ORAL | Status: DC
  Administered 2016-02-12: 10 mg via ORAL
  Filled 2016-02-12: qty 1

## 2016-02-12 NOTE — Discharge Summary (Signed)
Physician Discharge Summary  Sherren MochaSammy Burton ZOX:[id number] DOB: Oct 31, 1965 DOA: 02/10/2016  PCP: No PCP Per Patient  Admit date: 02/10/2016 Discharge date: 02/12/2016  Admitted From: Home Disposition: Discharge home  Recommendations for Outpatient Follow-up:  1. Follow up with PCP in 3-5 days  Home Health: No Equipment/Devices: No  Discharge Condition: Stable CODE STATUS: Full Diet recommendation: Heart healthy  Brief/Interim Summary: Juan Burton a 50 y.o.malewith PMHx  of hypertension. Patient presented to the ED for an episode of left-sided chest pain. Initially with elevated BP, and patient was not compliant with medications. Placed in obs for CP r/o ASC. BP better control although diastolic was slight elevated. TNI negative x 3 and no EKG changes. Cardio consulted, Coronary CT results score 0. Discussed with cardiologist no further recommendation. Norvasc was changed to 10 mg, continue metoprolol. Aspirin was ordered for cardiac prevention.   Discharge Diagnoses:  Principal Problem:   Chest pain Active Problems:   Essential hypertension  Chest pain: Resolved - Likely stress and High BP related, no evidence of CAD Risk factors include LVH, hypertension, age. TNIs negative x 3, EKG no sig changes. Coronary CT normal, cardiac score 0 - Continue aspirin 81 mg daily - Recommended to improved diet, relaxation techniques discussed and increase in physical activity.  - Follow-up with primary care physician  Essential Hypertension - Increased Amlodipine to 10mg  daily - prescription given.  - Continue metoprolol 50 mg daily  Discharge Instructions  Discharge Instructions    Diet - low sodium heart healthy    Complete by:  As directed   Increase activity slowly    Complete by:  As directed       Medication List    STOP taking these medications   butalbital-acetaminophen-caffeine 50-325-40 MG tablet Commonly known as:  FIORICET   silver sulfADIAZINE 1 % cream Commonly  known as:  SILVADENE     TAKE these medications   amLODipine 10 MG tablet Commonly known as:  NORVASC Take 1 tablet (10 mg total) by mouth daily. What changed:  medication strength  how much to take   aspirin 81 MG EC tablet Take 1 tablet (81 mg total) by mouth daily.   metoprolol succinate 50 MG 24 hr tablet Commonly known as:  TOPROL-XL Take one daily for blood pressure       No Known Allergies  Consultations:  Cardiology - Dr. Andreas OhmKatrina Nelson    Procedures/Studies: Dg Chest 2 View  Result Date: 02/10/2016 CLINICAL DATA:  Chest pain. EXAM: CHEST  2 VIEW COMPARISON:  No recent prior. FINDINGS: Mediastinum and hilar structures normal. Low lung volumes with mild basilar atelectasis. No pleural effusion pneumothorax. Heart size normal. No acute bony abnormality . IMPRESSION: Low lung volumes with mild bibasilar atelectasis. Electronically Signed   By: Maisie Fushomas  Register   On: 02/10/2016 12:09   Ct Coronary Morph W/cta Cor Teressa SenterW/score W/ca W/cm &/or Wo/cm  Addendum Date: 02/11/2016   ADDENDUM REPORT: 02/11/2016 12:17 CLINICAL DATA:  50 year old male with exertional chest pain. EXAM: Cardiac/Coronary  CT TECHNIQUE: The patient was scanned on a Philips 256 scanner. FINDINGS: A 120 kV prospective scan was triggered in the descending thoracic aorta at 111 HU's. Axial non-contrast 3 mm slices were carried out through the heart. The data set was analyzed on a dedicated work station and scored using the Agatson method. Gantry rotation speed was 270 msecs and collimation was .9 mm. 25 mg of PO metoprolol and 0.8 mg of sl NTG was given. The 3D data set was reconstructed  in 5% intervals of the 67-82 % of the R-R cycle. Diastolic phases were analyzed on a dedicated work station using MPR, MIP and VRT modes. The patient received 80 cc of contrast. Aorta:  Normal caliber.  No calcifications.  No dissection. Aortic Valve:  Trileaflet.  No calcifications. Coronary Arteries:  Normal origin with right  dominance. Left main is a large artery with plaque. LAD is a large caliber artery that wraps around the apex that gives rise to two diagonal branches. There is no plaque. LCX artery is a medium caliber non-dominant vessel that gives rise to two rather small obtuse marginal branches. There is no plaque. RCA is a large caliber dominant vessel that gives rise to PDA and PLA. There is no plaque. Other findings: Mild concentric hypertrophy. Normal pulmonary vein drainage into the left atrium. No thrombus in the left atrial appendage. No ASD/VSD identified. IMPRESSION: 1. Coronary calcium score of 0. This was 0 percentile for age and sex matched control. 2.  Normal coronary origin with right dominance. 3.  No evidence od CAD. 4.  Mild concentric hypertrophy. Tobias Alexander Electronically Signed   By: Tobias Alexander   On: 02/11/2016 12:17   Result Date: 02/11/2016 EXAM: OVER-READ INTERPRETATION  CT CHEST The following report is an over-read performed by radiologist Dr. Royal Piedra [facility] on 02/11/2016. This over-read does not include interpretation of cardiac or coronary anatomy or pathology. The coronary calcium score/coronary CTA interpretation by the cardiologist is attached. COMPARISON:  None. FINDINGS: In the superior segment of the right lower lobe (image 21 of series 204) there is a 9 x 7 mm pulmonary nodule (mean diameter of 8 mm). Within the visualized portions of the thorax there are no other larger more suspicious appearing pulmonary nodules or masses. There is no acute consolidative airspace disease, no pleural effusions, no pneumothorax and no lymphadenopathy. Mild linear scarring in the inferior segment of the lingula and in the lateral aspect of the left lower lobe. Visualized portions of the upper abdomen are unremarkable. There are no aggressive appearing lytic or blastic lesions noted in the visualized portions of the skeleton. IMPRESSION: 1. **An incidental finding of potential  clinical significance has been found. Small pulmonary nodule with a mean diameter of 8 mm in the superior segment of the right lower lobe. Non-contrast chest CT at 6 months is recommended. If the nodule is stable at time of repeat CT, then future CT at 18-24 months (from today's scan) is considered optional for low-risk patients, but is recommended for high-risk patients. This recommendation follows the consensus statement: Guidelines for Management of Incidental Pulmonary Nodules Detected on CT Images:From the Fleischner Society 2017; published online before print (10.1148/radiol.4098119147). ** Electronically Signed: By: Trudie Reed M.D. On: 02/11/2016 11:33     ECHO Study Conclusions  - Left ventricle: The cavity size was normal. Systolic function was   normal. The estimated ejection fraction was in the range of 50%   to 55%. Wall motion was normal; there were no regional wall   motion abnormalities. Left ventricular diastolic function   parameters were normal. - Aortic valve: Transvalvular velocity was within the normal range.   There was no stenosis. There was no regurgitation. - Mitral valve: Transvalvular velocity was within the normal range.   There was no evidence for stenosis. There was no regurgitation. - Right ventricle: The cavity size was normal. Wall thickness was   normal. Systolic function was normal. - Atrial septum: No defect or patent foramen  ovale was identified   by color flow Doppler. - Tricuspid valve: There was trivial regurgitation. - Pulmonary arteries: Systolic pressure was within the normal   range. PA peak pressure: 19 mm Hg (S).   Subjective:  Patient have no complaints this morning. No acute events overnight.  Discharge Exam: Vitals:   02/11/16 2112 02/12/16 0444  BP: (!) 143/87 (!) 147/92  Pulse: 65 69  Resp: 18 18  Temp: 98.1 F (36.7 C) 98.3 F (36.8 C)   Vitals:   02/11/16 1341 02/11/16 1740 02/11/16 2112 02/12/16 0444  BP: (!) 153/88  (!) 151/97 (!) 143/87 (!) 147/92  Pulse: 69  65 69  Resp: 18  18 18   Temp: 98.2 F (36.8 C)  98.1 F (36.7 C) 98.3 F (36.8 C)  TempSrc: Oral  Oral Oral  SpO2: 98%  100% 99%  Weight:      Height:        General: Pt is alert, awake, not in acute distress Cardiovascular: RRR, S1/S2 +, no rubs, no gallops Respiratory: CTA bilaterally, no wheezing, no rhonchi Abdominal: Soft, NT, ND, bowel sounds + Extremities: no edema, no cyanosis    The results of significant diagnostics from this hospitalization (including imaging, microbiology, ancillary and laboratory) are listed below for reference.     Microbiology: No results found for this or any previous visit (from the past 240 hour(s)).   Labs: BNP (last 3 results) No results for input(s): BNP in the last 8760 hours. Basic Metabolic Panel:  Recent Labs Lab 02/10/16 1047  NA 141  K 4.1  CL 104  CO2 31  GLUCOSE 103*  BUN 12  CREATININE 1.07  CALCIUM 9.5   Liver Function Tests: No results for input(s): AST, ALT, ALKPHOS, BILITOT, PROT, ALBUMIN in the last 168 hours. No results for input(s): LIPASE, AMYLASE in the last 168 hours. No results for input(s): AMMONIA in the last 168 hours. CBC:  Recent Labs Lab 02/10/16 1047  WBC 5.7  HGB 15.0  HCT 42.8  MCV 92.2  PLT 211   Cardiac Enzymes:  Recent Labs Lab 02/10/16 1906 02/11/16 0046 02/11/16 0641  TROPONINI <0.03 <0.03 <0.03   BNP: Invalid input(s): POCBNP CBG: No results for input(s): GLUCAP in the last 168 hours. D-Dimer No results for input(s): DDIMER in the last 72 hours. Hgb A1c  Recent Labs  02/10/16 1906  HGBA1C 5.2   Lipid Profile  Recent Labs  02/11/16 0641  CHOL 192  HDL 38*  LDLCALC 136*  TRIG 90  CHOLHDL 5.1   Thyroid function studies  Recent Labs  02/10/16 1906  TSH 1.075   Anemia work up No results for input(s): VITAMINB12, FOLATE, FERRITIN, TIBC, IRON, RETICCTPCT in the last 72 hours. Urinalysis    Component Value  Date/Time   COLORURINE YELLOW 02/20/2011 0643   APPEARANCEUR CLOUDY (A) 02/20/2011 0643   LABSPEC 1.025 02/20/2011 0643   PHURINE 6.0 02/20/2011 0643   GLUCOSEU NEGATIVE 02/20/2011 0643   HGBUR NEGATIVE 02/20/2011 0643   BILIRUBINUR NEGATIVE 02/20/2011 0643   KETONESUR TRACE (A) 02/20/2011 0643   PROTEINUR NEGATIVE 02/20/2011 0643   UROBILINOGEN 1.0 02/20/2011 0643   NITRITE NEGATIVE 02/20/2011 0643   LEUKOCYTESUR LARGE (A) 02/20/2011 0643   Sepsis Labs Invalid input(s): PROCALCITONIN,  WBC,  LACTICIDVEN Microbiology No results found for this or any previous visit (from the past 240 hour(s)).   Time coordinating discharge: Over 30 minutes  SIGNED:  Lenox Ponds, MD  [facility] 02/12/2016, 9:06 AM Pager  If 7PM-7AM, please contact night-coverage [email or web address] Password [email or web address]

## 2016-02-12 NOTE — Discharge Instructions (Signed)

## 2016-02-12 NOTE — Progress Notes (Signed)
PROGRESS NOTE    Juan Burton  ZOX:[id number] DOB: 1966-01-18 DOA: 02/10/2016 PCP: No PCP Per Patient    Brief Narrative:  Juan Burton is a 50 y.o. male with PMHx  of hypertension. Patient presented to the ED for an episode of left-sided chest pain. Initially with elevated Bp, no compliace with medications. Placed in obs for CP r/o ASC. BP better control. TNI negative x 3 and no EKG changes. Cardio consulted, Coronary CT ordered. Coronary CT result score 0. Discuss with cardiologist patient doesn't need to be seen. Recommend a follow-up as an outpatient with PCP. No further Cardiologic recommendation.    Assessment & Plan:   Principal Problem:   Chest pain Active Problems:   Essential hypertension   Chest pain: Resolved - Likely stress and High BP related, no evidence of CAD Sounds somewhat typical. Risk factors include LVH, hypertension, age, slightly suspicious history.Heart score 4 Chest pain is resolved. TNIs negative x 3, EKG no sig changes. Coronary CT normal, cardiac score 0  - Discussed with cardiology, patient ok to be discharge.  - Counseling on medication compliance, follow with PCP.   Essential Hypertension Improved after resuming medications although diastolic pressure in the high limit 90s - Increase Amlodipine 10mg  daily - Continue metoprolol 50 mg daily  DVT prophylaxis:  Heparin subcutaneous Code Status:  Full code Family Communication:  None at bedside Disposition Plan:  Cleared by cardio, patient to be discharge home today.   Consultants:   Cardiology consulted     Antimicrobials: none    Subjective:   Patient seen and examined with sister on room at bedside. Patient report no complaints this morning, feeling much better.   Objective: Vitals:   02/11/16 1341 02/11/16 1740 02/11/16 2112 02/12/16 0444  BP: (!) 153/88 (!) 151/97 (!) 143/87 (!) 147/92  Pulse: 69  65 69  Resp: 18  18 18   Temp: 98.2 F (36.8 C)  98.1 F (36.7 C) 98.3 F (36.8  C)  TempSrc: Oral  Oral Oral  SpO2: 98%  100% 99%  Weight:      Height:       No intake or output data in the 24 hours ending 02/12/16 1305 Filed Weights   02/10/16 1044  Weight: 68 kg (150 lb)    Examination:  General exam: Appears calm and comfortable  Respiratory system: Clear to auscultation. Respiratory effort normal. Cardiovascular system: S1 & S2 heard, RRR. No JVD, murmurs, rubs, gallops or clicks. No pedal edema. Gastrointestinal system: Abdomen is nondistended, soft and nontender. No organomegaly or masses felt. Normal bowel sounds heard. Central nervous system: Alert and oriented. No focal neurological deficits   Data Reviewed: I have personally reviewed following labs and imaging studies  CBC:  Recent Labs Lab 02/10/16 1047  WBC 5.7  HGB 15.0  HCT 42.8  MCV 92.2  PLT 211   Basic Metabolic Panel:  Recent Labs Lab 02/10/16 1047  NA 141  K 4.1  CL 104  CO2 31  GLUCOSE 103*  BUN 12  CREATININE 1.07  CALCIUM 9.5   GFR: Estimated Creatinine Clearance: 74.5 mL/min (by C-G formula based on SCr of 1.07 mg/dL). Liver Function Tests: No results for input(s): AST, ALT, ALKPHOS, BILITOT, PROT, ALBUMIN in the last 168 hours. No results for input(s): LIPASE, AMYLASE in the last 168 hours. No results for input(s): AMMONIA in the last 168 hours. Coagulation Profile: No results for input(s): INR, PROTIME in the last 168 hours. Cardiac Enzymes:  Recent Labs Lab 02/10/16 1906  02/11/16 0046 02/11/16 0641  TROPONINI <0.03 <0.03 <0.03   BNP (last 3 results) No results for input(s): PROBNP in the last 8760 hours. HbA1C:  Recent Labs  02/10/16 1906  HGBA1C 5.2   CBG: No results for input(s): GLUCAP in the last 168 hours. Lipid Profile:  Recent Labs  02/11/16 0641  CHOL 192  HDL 38*  LDLCALC 136*  TRIG 90  CHOLHDL 5.1   Thyroid Function Tests:  Recent Labs  02/10/16 1906  TSH 1.075   Anemia Panel: No results for input(s): VITAMINB12,  FOLATE, FERRITIN, TIBC, IRON, RETICCTPCT in the last 72 hours. Sepsis Labs: No results for input(s): PROCALCITON, LATICACIDVEN in the last 168 hours.  No results found for this or any previous visit (from the past 240 hour(s)).       Radiology Studies: Ct Coronary Morph W/cta Cor W/score W/ca W/cm &/or Wo/cm  Addendum Date: 02/11/2016   ADDENDUM REPORT: 02/11/2016 12:17 CLINICAL DATA:  50 year old male with exertional chest pain. EXAM: Cardiac/Coronary  CT TECHNIQUE: The patient was scanned on a Philips 256 scanner. FINDINGS: A 120 kV prospective scan was triggered in the descending thoracic aorta at 111 HU's. Axial non-contrast 3 mm slices were carried out through the heart. The data set was analyzed on a dedicated work station and scored using the Agatson method. Gantry rotation speed was 270 msecs and collimation was .9 mm. 25 mg of PO metoprolol and 0.8 mg of sl NTG was given. The 3D data set was reconstructed in 5% intervals of the 67-82 % of the R-R cycle. Diastolic phases were analyzed on a dedicated work station using MPR, MIP and VRT modes. The patient received 80 cc of contrast. Aorta:  Normal caliber.  No calcifications.  No dissection. Aortic Valve:  Trileaflet.  No calcifications. Coronary Arteries:  Normal origin with right dominance. Left main is a large artery with plaque. LAD is a large caliber artery that wraps around the apex that gives rise to two diagonal branches. There is no plaque. LCX artery is a medium caliber non-dominant vessel that gives rise to two rather small obtuse marginal branches. There is no plaque. RCA is a large caliber dominant vessel that gives rise to PDA and PLA. There is no plaque. Other findings: Mild concentric hypertrophy. Normal pulmonary vein drainage into the left atrium. No thrombus in the left atrial appendage. No ASD/VSD identified. IMPRESSION: 1. Coronary calcium score of 0. This was 0 percentile for age and sex matched control. 2.  Normal coronary  origin with right dominance. 3.  No evidence od CAD. 4.  Mild concentric hypertrophy. Tobias AlexanderKatarina Nelson Electronically Signed   By: Tobias AlexanderKatarina  Nelson   On: 02/11/2016 12:17   Result Date: 02/11/2016 EXAM: OVER-READ INTERPRETATION  CT CHEST The following report is an over-read performed by radiologist Dr. Royal Pied[facility]iel [facility] on 02/11/2016. This over-read does not include interpretation of cardiac or coronary anatomy or pathology. The coronary calcium score/coronary CTA interpretation by the cardiologist is attached. COMPARISON:  None. FINDINGS: In the superior segment of the right lower lobe (image 21 of series 204) there is a 9 x 7 mm pulmonary nodule (mean diameter of 8 mm). Within the visualized portions of the thorax there are no other larger more suspicious appearing pulmonary nodules or masses. There is no acute consolidative airspace disease, no pleural effusions, no pneumothorax and no lymphadenopathy. Mild linear scarring in the inferior segment of the lingula and in the lateral aspect of the left lower lobe. Visualized portions  of the upper abdomen are unremarkable. There are no aggressive appearing lytic or blastic lesions noted in the visualized portions of the skeleton. IMPRESSION: 1. **An incidental finding of potential clinical significance has been found. Small pulmonary nodule with a mean diameter of 8 mm in the superior segment of the right lower lobe. Non-contrast chest CT at 6 months is recommended. If the nodule is stable at time of repeat CT, then future CT at 18-24 months (from today's scan) is considered optional for low-risk patients, but is recommended for high-risk patients. This recommendation follows the consensus statement: Guidelines for Management of Incidental Pulmonary Nodules Detected on CT Images:From the Fleischner Society 2017; published online before print (10.1148/radiol.1610960454). ** Electronically Signed: By: Trudie Reed M.D. On: 02/11/2016 11:33         Scheduled Meds: . amLODipine  10 mg Oral Daily  . aspirin EC  81 mg Oral Daily  . heparin  5,000 Units Subcutaneous Q8H  . sodium chloride flush  3 mL Intravenous Q12H   Continuous Infusions:    LOS: 0 days    Time spent: 20 min   Lenox Ponds, MD [facility] Pager [phone]  If 7PM-7AM, please contact night-coverage [email or web address] Password [email or web address] 02/12/2016, 1:05 PM

## 2016-02-12 NOTE — Care Management Note (Signed)
Case Management Note Donn PieriniKristi Geraldean Walen RN, BSN Unit 2W-Case Manager [phone]  Patient Details  Name: Juan Burton MRN: [id number] Date of Birth: Oct 26, 1965  Subjective/Objective:   Pt admitted with chest pain                 Action/Plan: PTA  Pt lived at home-independent- plan to return home- CM asked to see pt regarding PCP needs- spoke with pt at bedside- confirmed that pt does have insurance with [employer]- info given to pt on [facility] referral line in addition pt can use the # on his insurance card to get list of in-network providers- pt to f/u on finding PCP locally that is in network for his insurance. No further needs noted  Expected Discharge Date:    02/12/16              Expected Discharge Plan:  Home/Self Care  In-House Referral:     Discharge planning Services  CM Consult  Post Acute Care Choice:    Choice offered to:     DME Arranged:    DME Agency:     HH Arranged:    HH Agency:     Status of Service:  Completed, signed off  If discussed at [employer] Length of Stay Meetings, dates discussed:    Additional Comments:  Juan Burton, Juan Schroepfer Hall, RN 02/12/2016, 10:05 AM

## 2016-03-03 ENCOUNTER — Other Ambulatory Visit: Payer: Self-pay | Admitting: Family Medicine

## 2016-03-14 ENCOUNTER — Ambulatory Visit (INDEPENDENT_AMBULATORY_CARE_PROVIDER_SITE_OTHER): Payer: BLUE CROSS/BLUE SHIELD | Admitting: Family Medicine

## 2016-03-14 ENCOUNTER — Other Ambulatory Visit: Payer: Self-pay

## 2016-03-14 DIAGNOSIS — I10 Essential (primary) hypertension: Secondary | ICD-10-CM

## 2016-03-14 DIAGNOSIS — G4452 New daily persistent headache (NDPH): Secondary | ICD-10-CM

## 2016-03-14 MED ORDER — HYDROCHLOROTHIAZIDE 12.5 MG PO CAPS
12.5000 mg | ORAL_CAPSULE | Freq: Every day | ORAL | 0 refills | Status: DC
Start: 2016-03-14 — End: 2016-07-09

## 2016-03-14 MED ORDER — METOPROLOL SUCCINATE ER 25 MG PO TB24: 25.0000 mg | ORAL_TABLET | Freq: Every day | ORAL | 0 refills | Status: DC

## 2016-03-14 MED ORDER — AMLODIPINE BESYLATE 10 MG PO TABS: 10.0000 mg | ORAL_TABLET | Freq: Every day | ORAL | 0 refills | Status: DC

## 2016-03-14 MED ORDER — ASPIRIN 81 MG PO TBEC: 81.0000 mg | DELAYED_RELEASE_TABLET | Freq: Every day | ORAL | 3 refills | Status: AC

## 2016-03-14 NOTE — Assessment & Plan Note (Addendum)
Poorly controlled. Initially 154/92; improved to 140/80 after sitting for ~10 minutes. Reports compliance with all medications.  - Continue amlodipine 10mg   - Continue ASA 81mg   - Decrease metoprolol to 25mg  qd, with goal of weaning off entirely potentially at next visit - Begin HCTZ 12.5mg  qd - Return for f/u in 3 months, or sooner if BP remains elevated

## 2016-03-14 NOTE — Progress Notes (Signed)
   Subjective:    Patient ID: Juan Burton, male    DOB: [birth year], 50 y.o.   MRN: [id number]  HPI  Patient presents for HTN f/u.   HTN Patient recently hospitalized (9/6-9/7) for chest pain, and was told to f/u with his PCP after discharge for HTN f/u. Chest pain thought to be secondary to stress and HTN, as no abnormalities were noted on cardiac work-up. Patient's amlodipine was increased from 5mg  to 10mg  at discharge, and he was started on ASA 81mg . He reports compliance with these two meds, as well as his previously prescribed metoprolol 50mg .  Denies recent vision changes, chest pain, SOB. Infrequent headaches, but this is not a new problem. Denies LE edema.  Review of Systems See HPI.     Objective:   Physical Exam  Constitutional: He is oriented to person, place, and time. He appears well-developed and well-nourished. No distress.  HENT:  Head: Normocephalic and atraumatic.  Cardiovascular: Normal rate, regular rhythm and normal heart sounds.   No murmur heard. Pulmonary/Chest: Effort normal and breath sounds normal. No respiratory distress. He has no wheezes.  Musculoskeletal: He exhibits no edema.  Neurological: He is alert and oriented to person, place, and time.  Psychiatric: He has a normal mood and affect. His behavior is normal.  Vitals reviewed.     Assessment & Plan:  Essential hypertension Poorly controlled. Initially 154/92; improved to 140/80 after sitting for ~10 minutes. Reports compliance with all medications.  - Continue amlodipine 10mg   - Continue ASA 81mg   - Decrease metoprolol to 25mg  qd, with goal of weaning off entirely potentially at next visit - Begin HCTZ 12.5mg  qd - Return for f/u in 3 months, or sooner if BP remains elevated  Tarri AbernethyAbigail J Magalene Mclear, MD, MPH

## 2016-03-14 NOTE — Telephone Encounter (Signed)
Patient needs his butalbital-acetaminophen-caffeine (FIORICET) 50-325-40 MG tablet refilled. States that Dr Neva SeatGreene was suppose to call it in with the rest of his meds from his OV on 03/14/16 but he did not.  He uses the [facility] on [address].

## 2016-03-14 NOTE — Patient Instructions (Addendum)
IF you received an x-ray today, you will receive an invoice from [facility]. Please contact [facility] at [phone] with questions or concerns regarding your invoice.   IF you received labwork today, you will receive an invoice from [facility]. Please contact [facility] at [phone] with questions or concerns regarding your invoice.   Our billing staff will not be able to assist you with questions regarding bills from these companies.  You will be contacted with the lab results as soon as they are available. The fastest way to get your results is to activate your My Chart account. Instructions are located on the last page of this paperwork. If you have not heard from us regarding the results in 2 weeks, please contact this office.     It was nice meeting you today Juan Burton!  We have made some changes to your medications today. Please begin taking HCTZ 12.5mg  once a day. This may make you urinate more frequently, so take it first thing in the morning. Decrease your dose of Toprol (metoprolol) to 25 mg a day. Finally, continue to take amlodipine 10 mg as you were.   Decreasing the amount of salt in your diet will also help improve your blood pressure. I have included information about the DASH diet, a low salt diet.   If you notice that your blood pressure is still high after a couple weeks, please call to schedule an appointment, otherwise Dr. Neva SeatGreene will see you back in about three months.   If you have any questions or concerns, please feel free to call the clinic.   Be well,  Dr. Natale Milch[place]ancaster  DASH Eating Plan DASH stands for "Dietary Approaches to Stop Hypertension." The DASH eating plan is a healthy eating plan that has been shown to reduce high blood pressure (hypertension). Additional health benefits may include reducing the risk of type 2 diabetes mellitus, heart disease, and stroke. The DASH eating plan may also help with  weight loss. WHAT DO I NEED TO KNOW ABOUT THE DASH EATING PLAN? For the DASH eating plan, you will follow these general guidelines:  Choose foods with a percent daily value for sodium of less than 5% (as listed on the food label).  Use salt-free seasonings or herbs instead of table salt or sea salt.  Check with your health care provider or pharmacist before using salt substitutes.  Eat lower-sodium products, often labeled as "lower sodium" or "no salt added."  Eat fresh foods.  Eat more vegetables, fruits, and low-fat dairy products.  Choose whole grains. Look for the word "whole" as the first word in the ingredient list.  Choose fish and skinless chicken or [place] more often than red meat. Limit fish, poultry, and meat to 6 oz (170 g) each day.  Limit sweets, desserts, sugars, and sugary drinks.  Choose heart-healthy fats.  Limit cheese to 1 oz (28 g) per day.  Eat more home-cooked food and less restaurant, buffet, and fast food.  Limit fried foods.  Cook foods using methods other than frying.  Limit canned vegetables. If you do use them, rinse them well to decrease the sodium.  When eating at a restaurant, ask that your food be prepared with less salt, or no salt if possible. WHAT FOODS CAN I EAT? Seek help from a dietitian for individual calorie needs. Grains Whole grain or whole wheat bread. Brown rice. Whole grain or whole wheat pasta. Quinoa, bulgur, and whole grain cereals. Low-sodium cereals. Corn  or whole wheat flour tortillas. Whole grain cornbread. Whole grain crackers. Low-sodium crackers. Vegetables Fresh or frozen vegetables (raw, steamed, roasted, or grilled). Low-sodium or reduced-sodium tomato and vegetable juices. Low-sodium or reduced-sodium tomato sauce and paste. Low-sodium or reduced-sodium canned vegetables.  Fruits All fresh, canned (in natural juice), or frozen fruits. Meat and Other Protein Products Ground beef (85% or leaner), grass-fed beef, or  beef trimmed of fat. Skinless chicken or [place]. Ground chicken or [place]. Pork trimmed of fat. All fish and seafood. Eggs. Dried beans, peas, or lentils. Unsalted nuts and seeds. Unsalted canned beans. Dairy Low-fat dairy products, such as skim or 1% milk, 2% or reduced-fat cheeses, low-fat ricotta or cottage cheese, or plain low-fat yogurt. Low-sodium or reduced-sodium cheeses. Fats and Oils Tub margarines without trans fats. Light or reduced-fat mayonnaise and salad dressings (reduced sodium). Avocado. Safflower, olive, or canola oils. Natural peanut or almond butter. Other Unsalted popcorn and pretzels. The items listed above may not be a complete list of recommended foods or beverages. Contact your dietitian for more options. WHAT FOODS ARE NOT RECOMMENDED? Grains White bread. White pasta. White rice. Refined cornbread. Bagels and croissants. Crackers that contain trans fat. Vegetables Creamed or fried vegetables. Vegetables in a cheese sauce. Regular canned vegetables. Regular canned tomato sauce and paste. Regular tomato and vegetable juices. Fruits Dried fruits. Canned fruit in light or heavy syrup. Fruit juice. Meat and Other Protein Products Fatty cuts of meat. Ribs, chicken wings, bacon, sausage, bologna, salami, chitterlings, fatback, hot dogs, bratwurst, and packaged luncheon meats. Salted nuts and seeds. Canned beans with salt. Dairy Whole or 2% milk, cream, half-and-half, and cream cheese. Whole-fat or sweetened yogurt. Full-fat cheeses or blue cheese. Nondairy creamers and whipped toppings. Processed cheese, cheese spreads, or cheese curds. Condiments Onion and garlic salt, seasoned salt, table salt, and sea salt. Canned and packaged gravies. Worcestershire sauce. Tartar sauce. Barbecue sauce. Teriyaki sauce. Soy sauce, including reduced sodium. Steak sauce. Fish sauce. Oyster sauce. Cocktail sauce. Horseradish. Ketchup and mustard. Meat flavorings and tenderizers. Bouillon cubes.  Hot sauce. Tabasco sauce. Marinades. Taco seasonings. Relishes. Fats and Oils Butter, stick margarine, lard, shortening, ghee, and bacon fat. Coconut, palm kernel, or palm oils. Regular salad dressings. Other Pickles and olives. Salted popcorn and pretzels. The items listed above may not be a complete list of foods and beverages to avoid. Contact your dietitian for more information. WHERE CAN I FIND MORE INFORMATION? National Heart, Lung, and Blood Institute: [employer]   This information is not intended to replace advice given to you by your health care provider. Make sure you discuss any questions you have with your health care provider.   Document Released: 05/12/2011 Document Revised: 06/13/2014 Document Reviewed: 03/27/2013 Elsevier Interactive Patient Education [employer]

## 2016-03-16 MED ORDER — BUTALBITAL-APAP-CAFFEINE 50-325-40 MG PO TABS: 1.0000 | ORAL_TABLET | Freq: Four times a day (QID) | ORAL | 0 refills | Status: DC | PRN

## 2016-03-16 NOTE — Telephone Encounter (Signed)
There may have been a misunderstanding at his last visit, as we were under the impression he did not need a refill. I did refill one time, but prior to further refills will need an office visit discuss this medication further

## 2016-03-16 NOTE — Telephone Encounter (Signed)
09/14/15 lasr rx.  Reported on 02/10/16 as not taking so pulled from med list.  Pt. Would like a refill please.

## 2016-03-17 NOTE — Telephone Encounter (Signed)
Called to listed pharmacy and original destroyed

## 2016-03-30 ENCOUNTER — Ambulatory Visit (INDEPENDENT_AMBULATORY_CARE_PROVIDER_SITE_OTHER): Payer: BLUE CROSS/BLUE SHIELD | Admitting: Physician Assistant

## 2016-03-30 ENCOUNTER — Encounter: Payer: Self-pay | Admitting: Physician Assistant

## 2016-03-30 VITALS — BP 136/84 | HR 89 | Temp 97.8°F | Resp 16 | Ht 66.0 in | Wt 159.6 lb

## 2016-03-30 DIAGNOSIS — M79674 Pain in right toe(s): Secondary | ICD-10-CM | POA: Diagnosis not present

## 2016-03-30 DIAGNOSIS — B351 Tinea unguium: Secondary | ICD-10-CM

## 2016-03-30 MED ORDER — FLUCONAZOLE 150 MG PO TABS: 150.0000 mg | ORAL_TABLET | ORAL | 0 refills | Status: DC

## 2016-03-30 MED ORDER — MELOXICAM 15 MG PO TABS: 7.5000 mg | ORAL_TABLET | Freq: Every day | ORAL | 0 refills | Status: DC
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
## 2016-03-30 NOTE — Patient Instructions (Signed)
     IF you received an x-ray today, you will receive an invoice from [facility]. Please contact [facility] at [phone] with questions or concerns regarding your invoice.   IF you received labwork today, you will receive an invoice from [facility]. Please contact [facility] at [phone] with questions or concerns regarding your invoice.   Our billing staff will not be able to assist you with questions regarding bills from these companies.  You will be contacted with the lab results as soon as they are available. The fastest way to get your results is to activate your My Chart account. Instructions are located on the last page of this paperwork. If you have not heard from us regarding the results in 2 weeks, please contact this office.      

## 2016-03-31 LAB — URIC ACID: URIC ACID, SERUM: 6.4 mg/dL (ref 4.0–8.0)

## 2016-04-11 ENCOUNTER — Telehealth: Payer: Self-pay

## 2016-04-11 ENCOUNTER — Other Ambulatory Visit: Payer: Self-pay | Admitting: Family Medicine

## 2016-04-11 DIAGNOSIS — I10 Essential (primary) hypertension: Secondary | ICD-10-CM

## 2016-04-11 NOTE — Telephone Encounter (Signed)
PATIENT WOULD LIKE THIS MESSAGE TO GO TO DR. Neva SeatGREENE: HE WOULD LIKE TO KNOW IF DR. GREENE WILL [facility]. HE IS HAVING A PROBLEM WITH E.D. HE HAS A FRIEND WHO DR. Neva SeatGREENE IS HIS PCP AND HE SAID DR. GREENE HELPED HIM. HIS FRIEND SAID DR. GREENE PROBABLY KNOWS ABOUT A PHARMACY IN [place] NAMED [facility] WHO SPECIALIZES IN THE GENERIC VIAGRA. (I SUGGESTED HE MAKE AN APPOINTMENT WITH DR. Neva SeatGREENE BUT HE JUST WANTED ME TO LEAVE HIM A MESSAGE.) BEST PHONE [phone] (CELL)  PHARMACY CHOICE IS [facility] [place]?  MBC

## 2016-04-15 NOTE — Telephone Encounter (Signed)
Although I would likely be able to prescribe him that medication, the risks and side effects need to be discussed as well as need to discuss his symptoms further to determine if other evaluation needed. This really needs to happen at an office visit. Please schedule appointment as soon as convenient for him.

## 2016-04-16 NOTE — Telephone Encounter (Signed)
Called, this number is to a hotel. I have been unable to reach patient. If he calls back he should make appointment, the other number listed for him is also not valid.

## 2016-05-01 ENCOUNTER — Other Ambulatory Visit: Payer: Self-pay | Admitting: Family Medicine

## 2016-05-01 ENCOUNTER — Other Ambulatory Visit: Payer: Self-pay | Admitting: Physician Assistant

## 2016-05-01 DIAGNOSIS — I10 Essential (primary) hypertension: Secondary | ICD-10-CM

## 2016-05-01 DIAGNOSIS — M79674 Pain in right toe(s): Secondary | ICD-10-CM

## 2016-05-25 ENCOUNTER — Other Ambulatory Visit: Payer: Self-pay | Admitting: Family Medicine

## 2016-05-25 DIAGNOSIS — I10 Essential (primary) hypertension: Secondary | ICD-10-CM

## 2016-05-31 NOTE — Telephone Encounter (Signed)
03/2016 last ov and labs 

## 2016-06-08 ENCOUNTER — Other Ambulatory Visit: Payer: Self-pay | Admitting: Family Medicine

## 2016-06-08 DIAGNOSIS — I10 Essential (primary) hypertension: Secondary | ICD-10-CM

## 2016-06-09 NOTE — Telephone Encounter (Signed)
Unable to reach patient at either number.  Denied prescription in hopes he calls back.  He is due for a follow up.   Has he completed his taper of HCTZ

## 2016-06-26 ENCOUNTER — Other Ambulatory Visit: Payer: Self-pay | Admitting: Family Medicine

## 2016-06-26 DIAGNOSIS — I10 Essential (primary) hypertension: Secondary | ICD-10-CM

## 2016-07-09 ENCOUNTER — Other Ambulatory Visit: Payer: Self-pay | Admitting: Family Medicine

## 2016-07-09 DIAGNOSIS — I10 Essential (primary) hypertension: Secondary | ICD-10-CM

## 2016-07-13 ENCOUNTER — Other Ambulatory Visit: Payer: Self-pay | Admitting: *Deleted

## 2016-07-13 DIAGNOSIS — I10 Essential (primary) hypertension: Secondary | ICD-10-CM

## 2016-07-13 MED ORDER — AMLODIPINE BESYLATE 10 MG PO TABS: 10.0000 mg | ORAL_TABLET | Freq: Every day | ORAL | 3 refills | Status: DC

## 2016-07-26 ENCOUNTER — Ambulatory Visit: Payer: BLUE CROSS/BLUE SHIELD

## 2016-07-27 ENCOUNTER — Ambulatory Visit: Payer: BLUE CROSS/BLUE SHIELD

## 2016-07-27 ENCOUNTER — Other Ambulatory Visit: Payer: Self-pay | Admitting: Physician Assistant

## 2016-07-27 DIAGNOSIS — I10 Essential (primary) hypertension: Secondary | ICD-10-CM

## 2016-07-28 ENCOUNTER — Ambulatory Visit (INDEPENDENT_AMBULATORY_CARE_PROVIDER_SITE_OTHER): Payer: BLUE CROSS/BLUE SHIELD | Admitting: Family Medicine

## 2016-07-28 ENCOUNTER — Telehealth: Payer: Self-pay

## 2016-07-28 VITALS — BP 142/80 | HR 89 | Temp 98.3°F | Resp 16 | Ht 66.0 in | Wt 160.0 lb

## 2016-07-28 DIAGNOSIS — J069 Acute upper respiratory infection, unspecified: Secondary | ICD-10-CM | POA: Diagnosis not present

## 2016-07-28 DIAGNOSIS — J209 Acute bronchitis, unspecified: Secondary | ICD-10-CM | POA: Diagnosis not present

## 2016-07-28 DIAGNOSIS — B9789 Other viral agents as the cause of diseases classified elsewhere: Secondary | ICD-10-CM

## 2016-07-28 MED ORDER — HYDROCODONE-HOMATROPINE 5-1.5 MG/5ML PO SYRP: 5.0000 mL | ORAL_SOLUTION | Freq: Three times a day (TID) | ORAL | 0 refills | Status: DC | PRN

## 2016-07-28 MED ORDER — AZITHROMYCIN 250 MG PO TABS: ORAL_TABLET | ORAL | 0 refills | Status: DC

## 2016-07-28 NOTE — Telephone Encounter (Signed)
Pt is returning Chelle's call please advise [phone]

## 2016-07-28 NOTE — Telephone Encounter (Signed)
Meds ordered this encounter  Medications  . metoprolol succinate (TOPROL-XL) 25 MG 24 hr tablet    Sig: TAKE 1 TABLET BY MOUTH DAILY FOR BLOOD PRESSURE (DUE FOR OFFICE VISIT FOR ADD'L REFILLS)    Dispense:  30 tablet    Refill:  0    Unable to reach patient by phone or emergency contacts. Please advise him he needs office visit for additional refills.   Mr. Bryon LionsBreedlove (emergency contact) stated that he doesn't have the patient's number, but will give him the message to call here when he sees the patient tomorrow at work.

## 2016-07-28 NOTE — Patient Instructions (Addendum)
Take the Azithromycin Take 2 pills today and then 1 pill daily after that.    Use the hycodan cough syrup every 6 - 8 hours as needed for your cough.    This may have been triggered by the flu.  If so that should run its course in the next several days. Make sure to take either Tylenol or ibuprofen if you're having any fevers or having.  You should be better within the week. If not come back and see us. If you start feeling worse do not wait and come back immediately or go to the emergency department.    IF you received an x-ray today, you will receive an invoice from [facility]. Please contact [facility] at [phone] with questions or concerns regarding your invoice.   IF you received labwork today, you will receive an invoice from [facility]. Please contact [facility] at [phone] with questions or concerns regarding your invoice.   Our billing staff will not be able to assist you with questions regarding bills from these companies.  You will be contacted with the lab results as soon as they are available. The fastest way to get your results is to activate your My Chart account. Instructions are located on the last page of this paperwork. If you have not heard from us regarding the results in 2 weeks, please contact this office.

## 2016-07-28 NOTE — Progress Notes (Signed)
Subjective:    Juan Burton is a 51 y.o. male who presents to PCP today for flu like symptoms::  1.  Flu like symptoms:  Present for past 2 weeks.  Has had ongoing sinus congestion, sore throat, cough. Since Monday to be having some fevers. With night sweats. As week continued this has actually improved. Still coughing up thick yellow sputum. He has history of bronchitis that develops after he has a viral URI. No wheezing. No history of asthma or lung disease. No dyspnea on exertion. No chest pain.  The [facility]. No abdominal pain. No nausea or vomiting. No diarrhea.  Did not get flu shot this year.    ROS as above per HPI.   The following portions of the patient's history were reviewed and updated as appropriate: allergies, current medications, past medical history, family and social history, and problem list. Patient is a nonsmoker.    PMH reviewed.  Past Medical History:  Diagnosis Date  . Hypertension    Past Surgical History:  Procedure Laterality Date  . NO PAST SURGERIES      Medications reviewed. Current Outpatient Prescriptions  Medication Sig Dispense Refill  . amLODipine (NORVASC) 10 MG tablet Take 1 tablet (10 mg total) by mouth daily. 30 tablet 3  . aspirin 81 MG EC tablet Take 1 tablet (81 mg total) by mouth daily. 90 tablet 3  . hydrochlorothiazide (MICROZIDE) 12.5 MG capsule TAKE ONE CAPSULE BY MOUTH DAILY 30 capsule 0  . meloxicam (MOBIC) 15 MG tablet Take 0.5-1 tablets (7.5-15 mg total) by mouth daily. 30 tablet 0  . metoprolol succinate (TOPROL-XL) 25 MG 24 hr tablet TAKE 1 TABLET BY MOUTH DAILY FOR BLOOD PRESSURE (DUE FOR OFFICE VISIT FOR ADD'L REFILLS) 30 tablet 0   No current facility-administered medications for this visit.      Objective:   Physical Exam BP (!) 142/80   Pulse 89   Temp 98.3 F (36.8 C) (Oral)   Resp 16   Ht 5\' 6"  (1.676 m)   Wt 160 lb (72.6 kg)   SpO2 97%   BMI 25.82 kg/m  Gen:  Patient sitting on exam table, appears  stated age in no acute distress Head: Normocephalic atraumatic Eyes: EOMI, PERRL, sclera and conjunctiva non-erythematous Ears:  Canals clear bilaterally.  TMs pearly gray bilaterally without erythema or bulging.   Nose:  Nasal turbinates erythematous with some clear exudates BL Mouth: Mucosa membranes moist. Tonsils +2, nonenlarged, non-erythematous. Neck: No cervical lymphadenopathy noted Heart:  RRR, no murmurs auscultated. Pulm:  Clear to auscultation bilaterally with good air movement.  No wheezes or rales noted.    No results found for this or any previous visit (from the past 72 hour(s)).   Imp/Plan: 1.  URI with cough: - ongoing now for the past 2 weeks. -He has past history of this developing bronchitis.  We'll treat as the same. -Started azithromycin today for the next 5 days. -Amoxicillin and Hycodan cough syrup to help with his cough. -He has no history of asthma or other lung problems. No wheezing on exam today. -Should be better in the next week or so. Follow up with us if no improvement by that point. -It is possible this was triggered or exacerbated by the flu. I did discuss with her that point an approximately helpful. His is beyond what we would start Tamiflu and he looks very well, no need to treat if he does have flu.

## 2016-07-29 ENCOUNTER — Telehealth: Payer: Self-pay

## 2016-07-29 NOTE — Telephone Encounter (Signed)
Lupita LeashDonna, please refer to PA-Jeffery's previous call note, "See my message from 07/28/2016, AM.  I refilled his medication, but needed to let him know that he was due for a visit. He was in the office yesterday afternoon for illness, but the provider was not aware of the message and did not address the patient's chronic illnesses."  I called the patient myself and he is aware but doing a very brief review in Epic would have avoided the back and forth. Thank you.

## 2016-07-29 NOTE — Telephone Encounter (Signed)
Returning a call from chelle -his voicemail is not set up yet so he will probably be calling back   Beset number [phone]

## 2016-07-29 NOTE — Telephone Encounter (Signed)
See my message from 07/28/2016, AM.  I refilled his medication, but needed to let him know that he was due for a visit. He was in the office yesterday afternoon for illness, but the provider was not aware of the message and did not address the patient's chronic illnesses.

## 2016-07-30 ENCOUNTER — Ambulatory Visit: Payer: BLUE CROSS/BLUE SHIELD

## 2016-08-06 ENCOUNTER — Other Ambulatory Visit: Payer: Self-pay | Admitting: Family Medicine

## 2016-08-06 DIAGNOSIS — I10 Essential (primary) hypertension: Secondary | ICD-10-CM

## 2016-08-09 ENCOUNTER — Ambulatory Visit (INDEPENDENT_AMBULATORY_CARE_PROVIDER_SITE_OTHER): Payer: BLUE CROSS/BLUE SHIELD | Admitting: Family Medicine

## 2016-08-09 VITALS — BP 122/80 | HR 90 | Temp 98.3°F | Resp 18 | Ht 66.0 in | Wt 163.0 lb

## 2016-08-09 DIAGNOSIS — J209 Acute bronchitis, unspecified: Secondary | ICD-10-CM | POA: Diagnosis not present

## 2016-08-09 DIAGNOSIS — I10 Essential (primary) hypertension: Secondary | ICD-10-CM | POA: Diagnosis not present

## 2016-08-09 DIAGNOSIS — R05 Cough: Secondary | ICD-10-CM

## 2016-08-09 DIAGNOSIS — R059 Cough, unspecified: Secondary | ICD-10-CM

## 2016-08-09 MED ORDER — AMLODIPINE BESYLATE 10 MG PO TABS: 10.0000 mg | ORAL_TABLET | Freq: Every day | ORAL | 1 refills | Status: DC

## 2016-08-09 MED ORDER — BENZONATATE 100 MG PO CAPS: 100.0000 mg | ORAL_CAPSULE | Freq: Three times a day (TID) | ORAL | 0 refills | Status: DC | PRN

## 2016-08-09 MED ORDER — HYDROCHLOROTHIAZIDE 25 MG PO TABS: 25.0000 mg | ORAL_TABLET | Freq: Every day | ORAL | 1 refills | Status: DC

## 2016-08-09 NOTE — Progress Notes (Signed)
Subjective:    Patient ID: Juan Burton, male    DOB: Aug 24, 1965, 51 y.o.   MRN: [id number]  HPI Juan Burton is a 51 y.o. male  Follow-up on bronchitis:  He was seen 2 weeks ago, February 22 by Dr. Gwendolyn Grant. Treated with azithromycin, Z-Pak, and hydrocodone cough syrup every 6-8 hours as needed. Cough is better, but still having cough.  Not waking up due to cough. Some cough spells during the day. Past few days out of hydrocodone. Has not tried other meds for cough.  Coricidin on past, not recently. No recent dyspnea or fever.   Hypertension: last visit with myself and Dr. Natale Milch on October 90 2017. He had been recently hospitalized at that time for chest pain, which was thought to be secondary to stress. No apparent concerns were noted on cardiac workup. His amlodipine was increased to 10 mg on discharge was started on aspirin. Metoprolol was decreased to 25 mg daily at that visit with plan to wean off if possible. Started on HCTZ 12.5 mg daily. New side effects on these medications, denies chest pain, shortness of breath.     Patient Active Problem List   Diagnosis Date Noted  . Chest pain 02/10/2016  . Essential hypertension 08/22/2013   Past Medical History:  Diagnosis Date  . Hypertension    Past Surgical History:  Procedure Laterality Date  . NO PAST SURGERIES     No Known Allergies Prior to Admission medications   Medication Sig Start Date End Date Taking? Authorizing Provider  amLODipine (NORVASC) 10 MG tablet Take 1 tablet (10 mg total) by mouth daily. 07/13/16  Yes Shade Flood, MD  aspirin 81 MG EC tablet Take 1 tablet (81 mg total) by mouth daily. 03/14/16  Yes Shade Flood, MD  hydrochlorothiazide (MICROZIDE) 12.5 MG capsule Take 1 capsule (12.5 mg total) by mouth daily. 08/06/16  Yes Shade Flood, MD  meloxicam (MOBIC) 15 MG tablet Take 0.5-1 tablets (7.5-15 mg total) by mouth daily. 03/30/16  Yes Ofilia Neas, PA-C  metoprolol succinate (TOPROL-XL)  25 MG 24 hr tablet TAKE 1 TABLET BY MOUTH DAILY FOR BLOOD PRESSURE (DUE FOR OFFICE VISIT FOR ADD'L REFILLS) 07/28/16  Yes Chelle Jeffery, PA-C  HYDROcodone-homatropine (HYCODAN) 5-1.5 MG/5ML syrup Take 5 mLs by mouth every 8 (eight) hours as needed for cough. Patient not taking: Reported on 08/09/2016 07/28/16   Tobey Grim, MD   Social History   Social History  . Marital status: Single    Spouse name: N/A  . Number of children: N/A  . Years of education: N/A   Occupational History  . Not on file.   Social History Main Topics  . Smoking status: Never Smoker  . Smokeless tobacco: Never Used  . Alcohol use No  . Drug use: No  . Sexual activity: No   Other Topics Concern  . Not on file   Social History Narrative  . No narrative on file     Review of Systems  Constitutional: Negative for fatigue and unexpected weight change.  Eyes: Negative for visual disturbance.  Respiratory: Negative for cough, chest tightness and shortness of breath.   Cardiovascular: Negative for chest pain, palpitations and leg swelling.  Gastrointestinal: Negative for abdominal pain and blood in stool.  Neurological: Negative for dizziness, light-headedness and headaches.       Objective:   Physical Exam  Constitutional: He is oriented to person, place, and time. He appears well-developed and well-nourished.  HENT:  Head: Normocephalic and atraumatic.  Eyes: EOM are normal. Pupils are equal, round, and reactive to light.  Neck: No JVD present. Carotid bruit is not present.  Cardiovascular: Normal rate, regular rhythm and normal heart sounds.   No murmur heard. Pulmonary/Chest: Effort normal and breath sounds normal. He has no rales.  Musculoskeletal: He exhibits no edema.  Neurological: He is alert and oriented to person, place, and time.  Skin: Skin is warm and dry.  Psychiatric: He has a normal mood and affect.  Vitals reviewed.  Vitals:   08/09/16 1439  BP: 122/80  Pulse: 90  Resp:  18  Temp: 98.3 F (36.8 C)  TempSrc: Oral  SpO2: 96%  Weight: 163 lb (73.9 kg)  Height:  (1.676 m)        Assessment & Plan:    [name] is a 51 y.o. male Acute bronchitis, unspecified organism Cough - Plan: benzonatate (TESSALON) 100 MG capsule  - Improving after prior antibiotic, with reassuring exam. Discussed postinfectious cough and timing. Transition to [employer] as needed, or over-the-counter Mucinex or Mucinex DM. RTC precautions if worsening.  Essential hypertension - Plan: hydrochlorothiazide (HYDRODIURIL) 25 MG tablet, amLODipine (NORVASC) 10 MG tablet, Basic metabolic panel, Care order/instruction:  - Stable, will transition to higher dose of HCTZ and try coming off of beta blocker as no apparent indication for beta blocker at this time. Continue same dose Norvasc. Check BMP, follow-up office visit in 3 months to assess hypertension control on this regimen. Sooner if worse  Meds ordered this encounter  Medications  . hydrochlorothiazide (HYDRODIURIL) 25 MG tablet    Sig: Take 1 tablet (25 mg total) by mouth daily.    Dispense:  90 tablet    Refill:  1  . amLODipine (NORVASC) 10 MG tablet    Sig: Take 1 tablet (10 mg total) by mouth daily.    Dispense:  90 tablet    Refill:  1  . benzonatate (TESSALON) 100 MG capsule    Sig: Take 1 capsule (100 mg total) by mouth 3 (three) times daily as needed for cough.    Dispense:  20 capsule    Refill:  0   Patient Instructions   Mucinex DM or Tessalon Perles for the cough. That she continue to improve. Return here if any fevers, shortness of breath or worsening symptoms.  Stop metoprolol, start new dose of hydrochlorothiazide and continue same dose of amlodipine. Follow-up in 3 months, sooner if any new or worsening symptoms.   Cough, Adult Coughing is a reflex that clears your throat and your airways. Coughing helps to heal and protect your lungs. It is normal to cough occasionally, but a cough that  happens with other symptoms or lasts a long time may be a sign of a condition that needs treatment. A cough may last only 2-3 weeks (acute), or it may last longer than 8 weeks (chronic). What are the causes? Coughing is commonly caused by:  Breathing in substances that irritate your lungs.  A viral or bacterial respiratory infection.  Allergies.  Asthma.  Postnasal drip.  Smoking.  Acid backing up from the stomach into the esophagus (gastroesophageal reflux).  Certain medicines.  Chronic lung problems, including COPD (or rarely, lung cancer).  Other medical conditions such as heart failure. Follow these instructions at home: Pay attention to any changes in your symptoms. Take these actions to help with your discomfort:  Take medicines only as told by your health care provider.  If you  were prescribed an antibiotic medicine, take it as told by your health care provider. Do not stop taking the antibiotic even if you start to feel better.  Talk with your health care provider before you take a cough suppressant medicine.  Drink enough fluid to keep your urine clear or pale yellow.  If the air is dry, use a cold steam vaporizer or humidifier in your bedroom or your home to help loosen secretions.  Avoid anything that causes you to cough at work or at home.  If your cough is worse at night, try sleeping in a semi-upright position.  Avoid cigarette smoke. If you smoke, quit smoking. If you need help quitting, ask your health care provider.  Avoid caffeine.  Avoid alcohol.  Rest as needed. Contact a health care provider if:  You have new symptoms.  You cough up pus.  Your cough does not get better after 2-3 weeks, or your cough gets worse.  You cannot control your cough with suppressant medicines and you are losing sleep.  You develop pain that is getting worse or pain that is not controlled with pain medicines.  You have a fever.  You have unexplained weight  loss.  You have night sweats. Get help right away if:  You cough up blood.  You have difficulty breathing.  Your heartbeat is very fast. This information is not intended to replace advice given to you by your health care provider. Make sure you discuss any questions you have with your health care provider. Document Released: 11/19/2010 Document Revised: 10/29/2015 Document Reviewed: 07/30/2014 Elsevier Interactive Patient Education  2017 [employer].   IF you received an x-ray today, you will receive an invoice from [facility]. Please contact [facility] at [phone] with questions or concerns regarding your invoice.   IF you received labwork today, you will receive an invoice from [place]. Please contact [facility] at [phone] with questions or concerns regarding your invoice.   Our billing staff will not be able to assist you with questions regarding bills from these companies.  You will be contacted with the lab results as soon as they are available. The fastest way to get your results is to activate your My Chart account. Instructions are located on the last page of this paperwork. If you have not heard from [place] regarding the results in 2 weeks, please contact this office.     Signed,   Meredith Staggers, MD [facility] at [facility].  08/10/16 1:14 PM

## 2016-08-09 NOTE — Patient Instructions (Addendum)
Mucinex DM or Tessalon Perles for the cough. That she continue to improve. Return here if any fevers, shortness of breath or worsening symptoms.  Stop metoprolol, start new dose of hydrochlorothiazide and continue same dose of amlodipine. Follow-up in 3 months, sooner if any new or worsening symptoms.   Cough, Adult Coughing is a reflex that clears your throat and your airways. Coughing helps to heal and protect your lungs. It is normal to cough occasionally, but a cough that happens with other symptoms or lasts a long time may be a sign of a condition that needs treatment. A cough may last only 2-3 weeks (acute), or it may last longer than 8 weeks (chronic). What are the causes? Coughing is commonly caused by:  Breathing in substances that irritate your lungs.  A viral or bacterial respiratory infection.  Allergies.  Asthma.  Postnasal drip.  Smoking.  Acid backing up from the stomach into the esophagus (gastroesophageal reflux).  Certain medicines.  Chronic lung problems, including COPD (or rarely, lung cancer).  Other medical conditions such as heart failure. Follow these instructions at home: Pay attention to any changes in your symptoms. Take these actions to help with your discomfort:  Take medicines only as told by your health care provider.  If you were prescribed an antibiotic medicine, take it as told by your health care provider. Do not stop taking the antibiotic even if you start to feel better.  Talk with your health care provider before you take a cough suppressant medicine.  Drink enough fluid to keep your urine clear or pale yellow.  If the air is dry, use a cold steam vaporizer or humidifier in your bedroom or your home to help loosen secretions.  Avoid anything that causes you to cough at work or at home.  If your cough is worse at night, try sleeping in a semi-upright position.  Avoid cigarette smoke. If you smoke, quit smoking. If you need help  quitting, ask your health care provider.  Avoid caffeine.  Avoid alcohol.  Rest as needed. Contact a health care provider if:  You have new symptoms.  You cough up pus.  Your cough does not get better after 2-3 weeks, or your cough gets worse.  You cannot control your cough with suppressant medicines and you are losing sleep.  You develop pain that is getting worse or pain that is not controlled with pain medicines.  You have a fever.  You have unexplained weight loss.  You have night sweats. Get help right away if:  You cough up blood.  You have difficulty breathing.  Your heartbeat is very fast. This information is not intended to replace advice given to you by your health care provider. Make sure you discuss any questions you have with your health care provider. Document Released: 11/19/2010 Document Revised: 10/29/2015 Document Reviewed: 07/30/2014 Elsevier Interactive Patient Education  2017 [employer]   IF you received an x-ray today, you will receive an invoice from [facility]. Please contact [facility] at [phone] with questions or concerns regarding your invoice.   IF you received labwork today, you will receive an invoice from [facility]. Please contact [facility] at [phone] with questions or concerns regarding your invoice.   Our billing staff will not be able to assist you with questions regarding bills from these companies.  You will be contacted with the lab results as soon as they are available. The fastest way to get your results is to activate your My Chart account.  Instructions are located on the last page of this paperwork. If you have not heard from [place] regarding the results in 2 weeks, please contact this office.

## 2016-08-10 LAB — BASIC METABOLIC PANEL
BUN/Creatinine Ratio: 20 (ref 9–20)
BUN: 23 mg/dL (ref 6–24)
CALCIUM: 9.6 mg/dL (ref 8.7–10.2)
CHLORIDE: 95 mmol/L — AB (ref 96–106)
CO2: 29 mmol/L (ref 18–29)
Creatinine, Ser: 1.16 mg/dL (ref 0.76–1.27)
GFR calc Af Amer: 84 mL/min/{1.73_m2} (ref >59)
GFR calc non Af Amer: 73 mL/min/{1.73_m2} (ref >59)
Glucose: 88 mg/dL (ref 65–99)
POTASSIUM: 3.7 mmol/L (ref 3.5–5.2)
Sodium: 138 mmol/L (ref 134–144)

## 2016-09-03 ENCOUNTER — Ambulatory Visit: Payer: BLUE CROSS/BLUE SHIELD

## 2016-11-02 ENCOUNTER — Other Ambulatory Visit: Payer: Self-pay | Admitting: Family Medicine

## 2016-11-02 DIAGNOSIS — I10 Essential (primary) hypertension: Secondary | ICD-10-CM

## 2016-11-07 ENCOUNTER — Ambulatory Visit: Payer: BLUE CROSS/BLUE SHIELD | Admitting: Family Medicine

## 2016-12-10 ENCOUNTER — Other Ambulatory Visit: Payer: Self-pay | Admitting: Emergency Medicine

## 2016-12-10 DIAGNOSIS — I10 Essential (primary) hypertension: Secondary | ICD-10-CM

## 2016-12-10 MED ORDER — AMLODIPINE BESYLATE 10 MG PO TABS: 10.0000 mg | ORAL_TABLET | Freq: Every day | ORAL | 1 refills | Status: DC

## 2017-01-30 ENCOUNTER — Other Ambulatory Visit: Payer: Self-pay | Admitting: Family Medicine

## 2017-01-30 DIAGNOSIS — I10 Essential (primary) hypertension: Secondary | ICD-10-CM

## 2017-03-08 ENCOUNTER — Other Ambulatory Visit: Payer: Self-pay | Admitting: Family Medicine

## 2017-03-08 DIAGNOSIS — I10 Essential (primary) hypertension: Secondary | ICD-10-CM

## 2017-03-14 ENCOUNTER — Other Ambulatory Visit: Payer: Self-pay | Admitting: Family Medicine

## 2017-03-14 ENCOUNTER — Telehealth: Payer: Self-pay | Admitting: Family Medicine

## 2017-03-14 DIAGNOSIS — I10 Essential (primary) hypertension: Secondary | ICD-10-CM

## 2017-03-14 NOTE — Telephone Encounter (Signed)
Pt is having a hard time with his schedule and getting an appt with dr [name] and he is completely out of his blood pressure meds and would like to know if it is possible to get him about 10 pills til he can make an appt   Best number [phone]

## 2017-03-16 ENCOUNTER — Other Ambulatory Visit: Payer: Self-pay

## 2017-03-16 DIAGNOSIS — I10 Essential (primary) hypertension: Secondary | ICD-10-CM

## 2017-03-16 MED ORDER — AMLODIPINE BESYLATE 10 MG PO TABS
10.0000 mg | ORAL_TABLET | Freq: Every day | ORAL | 0 refills | Status: DC
Start: 2017-03-16 — End: 2017-07-28

## 2017-03-16 NOTE — Telephone Encounter (Signed)
Refill request for hydrochlorothiazide 25 mg #15 with no refills given.  No more refill w/o ov.  Will send message to schedulers to call pt and setup appt with green for f/u htn. dgaddy,cma

## 2017-03-16 NOTE — Telephone Encounter (Signed)
Sent in 10 pills to pharmacy.

## 2017-03-18 ENCOUNTER — Ambulatory Visit: Payer: BLUE CROSS/BLUE SHIELD | Admitting: Family Medicine

## 2017-03-29 ENCOUNTER — Other Ambulatory Visit: Payer: Self-pay | Admitting: Family Medicine

## 2017-03-29 DIAGNOSIS — I10 Essential (primary) hypertension: Secondary | ICD-10-CM

## 2017-03-29 NOTE — Telephone Encounter (Signed)
Refill request denied for hctz 25 mg.  Pt given 15 tabs on 03/16/17 an advised to schedule appt.  Appt scheduled and pt no showed.  Will send to schedule appt with Neva SeatGreene.

## 2017-04-04 ENCOUNTER — Other Ambulatory Visit: Payer: Self-pay | Admitting: Family Medicine

## 2017-04-04 DIAGNOSIS — I10 Essential (primary) hypertension: Secondary | ICD-10-CM

## 2017-05-29 ENCOUNTER — Other Ambulatory Visit: Payer: Self-pay | Admitting: Family Medicine

## 2017-05-29 DIAGNOSIS — I10 Essential (primary) hypertension: Secondary | ICD-10-CM

## 2017-06-10 ENCOUNTER — Ambulatory Visit: Payer: BLUE CROSS/BLUE SHIELD | Admitting: Family Medicine

## 2017-07-11 ENCOUNTER — Other Ambulatory Visit: Payer: Self-pay | Admitting: Family Medicine

## 2017-07-11 DIAGNOSIS — I10 Essential (primary) hypertension: Secondary | ICD-10-CM

## 2017-07-11 NOTE — Telephone Encounter (Signed)
Hydrochlorothiazide refill request Last OV 08/09/16. Already had a partial refill done for 10 pills so he could make an appt.   He had an appt on 06/10/17 but pt cancelled it. Needs visit

## 2017-07-28 ENCOUNTER — Ambulatory Visit: Payer: BLUE CROSS/BLUE SHIELD | Admitting: Family Medicine

## 2017-07-28 ENCOUNTER — Other Ambulatory Visit: Payer: Self-pay

## 2017-07-28 ENCOUNTER — Encounter: Payer: Self-pay | Admitting: Family Medicine

## 2017-07-28 VITALS — BP 138/74 | HR 99 | Temp 98.6°F | Resp 16 | Ht 67.0 in | Wt 162.8 lb

## 2017-07-28 DIAGNOSIS — R21 Rash and other nonspecific skin eruption: Secondary | ICD-10-CM | POA: Diagnosis not present

## 2017-07-28 DIAGNOSIS — Z91148 Patient's other noncompliance with medication regimen for other reason: Secondary | ICD-10-CM

## 2017-07-28 DIAGNOSIS — I10 Essential (primary) hypertension: Secondary | ICD-10-CM

## 2017-07-28 DIAGNOSIS — L250 Unspecified contact dermatitis due to cosmetics: Secondary | ICD-10-CM

## 2017-07-28 DIAGNOSIS — Z9114 Patient's other noncompliance with medication regimen: Secondary | ICD-10-CM | POA: Diagnosis not present

## 2017-07-28 MED ORDER — HYDROCHLOROTHIAZIDE 25 MG PO TABS: ORAL_TABLET | ORAL | 1 refills | Status: DC

## 2017-07-28 MED ORDER — AMLODIPINE BESYLATE 10 MG PO TABS: 10.0000 mg | ORAL_TABLET | Freq: Every day | ORAL | 1 refills | Status: DC

## 2017-07-28 MED ORDER — TRIAMCINOLONE ACETONIDE 0.1 % EX CREA: 1.0000 "application " | TOPICAL_CREAM | Freq: Two times a day (BID) | CUTANEOUS | 0 refills | Status: DC

## 2017-07-28 NOTE — Progress Notes (Signed)
By signing my name below, I, [name], attest that this documentation has been prepared under the direction and in the presence of [name], [name], MD. Electronically Signed: [name], Medical Scribe 07/28/2017 at 4:32 PM. Subjective:    Patient ID: Juan Burton, male    DOB: Jun 02, 1966, 52 y.o.   MRN: [id number]   HPI Chief Complaint  Patient presents with  . Hypertension    f/u  . Rash    x 1 month, upper chest   Juan Burton is a 52 y.o. male who presents to Primary Care at [facility] for follow up of HTN as well as rash.   Health maintenance- He has not gotten a flu shot or colonoscopy. He does not want a flu shot. He would not prefer a colonoscopy and declined referral at this time.  HTN:  Last seen in March 2018 for bronchitis, at that time he was taking Norvasc 10 mg qd, hctz 12.5 qd, toprol 25mg . We stopped metoprolol as no apparent indication, increased hctz to 25 mg qd, continued norvasc 10mg  qd, and recommended 3 month follow up.   Lab Results  Component Value Date   CREATININE 1.16 08/09/2016   BP Readings from Last 3 Encounters:  07/28/17 (!) 150/90  08/09/16 122/80  07/28/16 (!) 142/80   He has not had any side effects of his medications. He has been not compliant with his meds. He ran out of both norvasc and hctz and has been using this sparingly, 3x a week and then not taking it for 4 days. He was checking his BP at home and 130/80s, with the occasional 140s/90s, and he also checks his BP at work on occasion. He is not fasting. He reports eating a lot of celery and avocado recently.   Rash: He complains of a rash on his chest and back for 1 month.  It is not improving or worsening. It does not affect his groin, arms, legs, or hands. He denies oral rash, no genital involvement He has used witch hazel with mild to no relief. He has not had this rash before. He denies itch or bleeding. 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 He denies exposure to these chemicals.  No fever.   Patient Active Problem List   Diagnosis Date Noted  . Chest pain 02/10/2016  . Essential hypertension 08/22/2013   Past Medical History:  Diagnosis Date  . Hypertension    Past Surgical History:  Procedure Laterality Date  . NO PAST SURGERIES     No Known Allergies Prior to Admission medications   Medication Sig Start Date End Date Taking? Authorizing Provider  amLODipine (NORVASC) 10 MG tablet Take 1 tablet (10 mg total) by mouth daily. WILL NEED TO SCHEDULE OV FOR FURTHER REFILLS 03/16/17  Yes Shade Flood, MD  aspirin 81 MG EC tablet Take 1 tablet (81 mg total) by mouth daily. 03/14/16  Yes Shade Flood, MD  hydrochlorothiazide (HYDRODIURIL) 25 MG tablet TAKE 1 TABLET BY MOUTH DAILY *OFFICE VISIT NEEDED FOR REFILLS* 05/29/17  Yes Shade Flood, MD  meloxicam (MOBIC) 15 MG tablet Take 0.5-1 tablets (7.5-15 mg total) by mouth daily. 03/30/16  Yes Ofilia Neas, PA-C   Social History   Socioeconomic History  . Marital status: Single    Spouse name: Not on file  . Number of children: Not on file  . Years of education: Not on file  . Highest education level:  Not on file  Social Needs  . Financial resource strain: Not on file  . Food insecurity - worry: Not on file  . Food insecurity - inability: Not on file  . Transportation needs - medical: Not on file  . Transportation needs - non-medical: Not on file  Occupational History  . Not on file  Tobacco Use  . Smoking status: Never Smoker  . Smokeless tobacco: Never Used  Substance and Sexual Activity  . Alcohol use: No  . Drug use: No  . Sexual activity: No    Birth control/protection: Abstinence  Other Topics Concern  . Not on file  Social History Narrative  . Not on file   Vitals:   07/28/17 1623  BP: (!) 150/90  Pulse: 99  Resp: 16  Temp: 98.6 F (37 C)  TempSrc: Oral  SpO2: 98%  Weight: 162 lb 12.8 oz (73.8 kg)  Height:  5\' 7"  (1.702 m)    Review of Systems  Constitutional: Negative for fatigue and unexpected weight change.  Eyes: Negative for visual disturbance.  Respiratory: Negative for cough, chest tightness and shortness of breath.   Cardiovascular: Negative for chest pain, palpitations and leg swelling.  Gastrointestinal: Negative for abdominal pain and blood in stool.  Skin: Positive for rash.  Allergic/Immunologic: Positive for food allergies.  Neurological: Negative for dizziness, light-headedness and headaches.       Objective:   Physical Exam  Constitutional: He is oriented to person, place, and time. He appears well-developed and well-nourished.  HENT:  Head: Normocephalic and atraumatic.  Eyes: EOM are normal. Pupils are equal, round, and reactive to light.  Neck: No JVD present. Carotid bruit is not present.  Cardiovascular: Normal rate, regular rhythm and normal heart sounds.  No murmur heard. Pulmonary/Chest: Effort normal and breath sounds normal. He has no rales.  Musculoskeletal: He exhibits no edema.  Neurological: He is alert and oriented to person, place, and time.  Skin: Skin is warm and dry.  Scattered, small papular, slightly hyperpigmented rash. This is spreading to his axilla bilaterally.  Upper back appears to be fairly clear. No arm, leg, foot, or hand rash.  Psychiatric: He has a normal mood and affect.  Vitals reviewed.     Assessment & Plan:    [name] is a 52 y.o. male Essential hypertension - Plan: hydrochlorothiazide (HYDRODIURIL) 25 MG tablet, amLODipine (NORVASC) 10 MG tablet, Basic metabolic panel H/O medication noncompliance   - Decreased control with intermittent adherence to medication. Stressed importance of daily medication routine follow-up. Tolerating previous doses with reported normal readings  - Restart HCTZ 25 mg daily, amlodipine 10 mg daily, check BMP, follow-up in 3-4 weeks with home readings. Plan on fasting labs with lipid panel at  that time  Rash and nonspecific skin eruption - Plan: triamcinolone cream (KENALOG) 0.1 % Contact dermatitis due to cosmetics, unspecified contact dermatitis type  - Likely contact dermatitis from change in body wash/deodorant, less likely eczema. Does not appear to affect upper shoulders or back, less likely tinea versicolor.  - Mild soap such as Dove or Cetaphil. Dove deodorant also option. Zyrtec or Allegra once per day as needed, Aveeno lotion to irritated areas, triamcinolone twice a day when necessary to affected areas and recheck in the next 2-3 weeks. Sooner if worsening  Meds ordered this encounter  Medications  . hydrochlorothiazide (HYDRODIURIL) 25 MG tablet    Sig: TAKE 1 TABLET BY MOUTH DAILY    Dispense:  90 tablet    Refill:  1  . amLODipine (NORVASC) 10 MG tablet    Sig: Take 1 tablet (10 mg total) by mouth daily.    Dispense:  90 tablet    Refill:  1  . triamcinolone cream (KENALOG) 0.1 %    Sig: Apply 1 application topically 2 (two) times daily.    Dispense:  45 g    Refill:  0   Patient Instructions    Restart meds at same doses, without missing doses. Keep a record of your blood pressures outside of the office and bring them to the next office visit in next 3 weeks to decide if med changes needed. Fasting for that visit to check cholesterol.  Walking for exercise if fine. See information on hypertension below.   Rash may be contact dermatitis from your soap or deodorant, or a type of eczema. Try the steroid cream to the darkened areas or rash twice per day for the next 2 weeks. Ok to use over the counter zyrtec or allegra once per day if itches, and use Aveeno lotion and a mild cleanser such as Cetaphil or Dove.  recheck in next 3 weeks.     Contact Dermatitis Dermatitis is redness, soreness, and swelling (inflammation) of the skin. Contact dermatitis is a reaction to certain substances that touch the skin. There are two types of contact dermatitis:  Irritant  contact dermatitis. This type is caused by something that irritates your skin, such as dry hands from washing them too much. This type does not require previous exposure to the substance for a reaction to occur. This type is more common.  Allergic contact dermatitis. This type is caused by a substance that you are allergic to, such as a nickel allergy or poison ivy. This type only occurs if you have been exposed to the substance (allergen) before. Upon a repeat exposure, your body reacts to the substance. This type is less common.  What are the causes? Many different substances can cause contact dermatitis. Irritant contact dermatitis is most commonly caused by exposure to:  Makeup.  Soaps.  Detergents.  Bleaches.  Acids.  Metal salts, such as nickel.  Allergic contact dermatitis is most commonly caused by exposure to:  Poisonous plants.  Chemicals.  Jewelry.  Latex.  Medicines.  Preservatives in products, such as clothing.  What increases the risk? This condition is more likely to develop in:  People who have jobs that expose them to irritants or allergens.  People who have certain medical conditions, such as asthma or eczema.  What are the signs or symptoms? Symptoms of this condition may occur anywhere on your body where the irritant has touched you or is touched by you. Symptoms include:  Dryness or flaking.  Redness.  Cracks.  Itching.  Pain or a burning feeling.  Blisters.  Drainage of small amounts of blood or clear fluid from skin cracks.  With allergic contact dermatitis, there may also be swelling in areas such as the eyelids, mouth, or genitals. How is this diagnosed? This condition is diagnosed with a medical history and physical exam. A patch skin test may be performed to help determine the cause. If the condition is related to your job, you may need to see an occupational medicine specialist. How is this treated? Treatment for this condition  includes figuring out what caused the reaction and protecting your skin from further contact. Treatment may also include:  Steroid creams or ointments. Oral steroid medicines may be needed in more severe cases.  Antibiotics  or antibacterial ointments, if a skin infection is present.  Antihistamine lotion or an antihistamine taken by mouth to ease itching.  A bandage (dressing).  Follow these instructions at home: Skin Care  Moisturize your skin as needed.  Apply cool compresses to the affected areas.  Try taking a bath with: ? Epsom salts. Follow the instructions on the packaging. You can get these at your local pharmacy or grocery store. ? Baking soda. Pour a small amount into the bath as directed by your health care provider. ? Colloidal oatmeal. Follow the instructions on the packaging. You can get this at your local pharmacy or grocery store.  Try applying baking soda paste to your skin. Stir water into baking soda until it reaches a paste-like consistency.  Do not scratch your skin.  Bathe less frequently, such as every other day.  Bathe in lukewarm water. Avoid using hot water. Medicines  Take or apply over-the-counter and prescription medicines only as told by your health care provider.  If you were prescribed an antibiotic medicine, take or apply your antibiotic as told by your health care provider. Do not stop using the antibiotic even if your condition starts to improve. General instructions  Keep all follow-up visits as told by your health care provider. This is important.  Avoid the substance that caused your reaction. If you do not know what caused it, keep a journal to try to track what caused it. Write down: ? What you eat. ? What cosmetic products you use. ? What you drink. ? What you wear in the affected area. This includes jewelry.  If you were given a dressing, take care of it as told by your health care provider. This includes when to change and remove  it. Contact a health care provider if:  Your condition does not improve with treatment.  Your condition gets worse.  You have signs of infection such as swelling, tenderness, redness, soreness, or warmth in the affected area.  You have a fever.  You have new symptoms. Get help right away if:  You have a severe headache, neck pain, or neck stiffness.  You vomit.  You feel very sleepy.  You notice red streaks coming from the affected area.  Your bone or joint underneath the affected area becomes painful after the skin has healed.  The affected area turns darker.  You have difficulty breathing. This information is not intended to replace advice given to you by your health care provider. Make sure you discuss any questions you have with your health care provider. Document Released: 05/20/2000 Document Revised: 10/29/2015 Document Reviewed: 10/08/2014 Elsevier Interactive Patient Education  2018 [employer].   Hypertension Hypertension, commonly called high blood pressure, is when the force of blood pumping through the arteries is too strong. The arteries are the blood vessels that carry blood from the heart throughout the body. Hypertension forces the heart to work harder to pump blood and may cause arteries to become narrow or stiff. Having untreated or uncontrolled hypertension can cause heart attacks, strokes, kidney disease, and other problems. A blood pressure reading consists of a higher number over a lower number. Ideally, your blood pressure should be below 120/80. The first ("top") number is called the systolic pressure. It is a measure of the pressure in your arteries as your heart beats. The second ("bottom") number is called the diastolic pressure. It is a measure of the pressure in your arteries as the heart relaxes. What are the causes? The cause of  this condition is not known. What increases the risk? Some risk factors for high blood pressure are under your control.  Others are not. Factors you can change  Smoking.  Having type 2 diabetes mellitus, high cholesterol, or both.  Not getting enough exercise or physical activity.  Being overweight.  Having too much fat, sugar, calories, or salt (sodium) in your diet.  Drinking too much alcohol. Factors that are difficult or impossible to change  Having chronic kidney disease.  Having a family history of high blood pressure.  Age. Risk increases with age.  Race. You may be at higher risk if you are African-American.  Gender. Men are at higher risk than women before age 52. After age 52, women are at higher risk than men.  Having obstructive sleep apnea.  Stress. What are the signs or symptoms? Extremely high blood pressure (hypertensive crisis) may cause:  Headache.  Anxiety.  Shortness of breath.  Nosebleed.  Nausea and vomiting.  Severe chest pain.  Jerky movements you cannot control (seizures).  How is this diagnosed? This condition is diagnosed by measuring your blood pressure while you are seated, with your arm resting on a surface. The cuff of the blood pressure monitor will be placed directly against the skin of your upper arm at the level of your heart. It should be measured at least twice using the same arm. Certain conditions can cause a difference in blood pressure between your right and left arms. Certain factors can cause blood pressure readings to be lower or higher than normal (elevated) for a short period of time:  When your blood pressure is higher when you are in a health care provider's office than when you are at home, this is called white coat hypertension. Most people with this condition do not need medicines.  When your blood pressure is higher at home than when you are in a health care provider's office, this is called masked hypertension. Most people with this condition may need medicines to control blood pressure.  If you have a high blood pressure reading  during one visit or you have normal blood pressure with other risk factors:  You may be asked to return on a different day to have your blood pressure checked again.  You may be asked to monitor your blood pressure at home for 1 week or longer.  If you are diagnosed with hypertension, you may have other blood or imaging tests to help your health care provider understand your overall risk for other conditions. How is this treated? This condition is treated by making healthy lifestyle changes, such as eating healthy foods, exercising more, and reducing your alcohol intake. Your health care provider may prescribe medicine if lifestyle changes are not enough to get your blood pressure under control, and if:  Your systolic blood pressure is above 130.  Your diastolic blood pressure is above 80.  Your personal target blood pressure may vary depending on your medical conditions, your age, and other factors. Follow these instructions at home: Eating and drinking  Eat a diet that is high in fiber and potassium, and low in sodium, added sugar, and fat. An example eating plan is called the DASH (Dietary Approaches to Stop Hypertension) diet. To eat this way: ? Eat plenty of fresh fruits and vegetables. Try to fill half of your plate at each meal with fruits and vegetables. ? Eat whole grains, such as whole wheat pasta, brown rice, or whole grain bread. Fill about one quarter of  your plate with whole grains. ? Eat or drink low-fat dairy products, such as skim milk or low-fat yogurt. ? Avoid fatty cuts of meat, processed or cured meats, and poultry with skin. Fill about one quarter of your plate with lean proteins, such as fish, chicken without skin, beans, eggs, and tofu. ? Avoid premade and processed foods. These tend to be higher in sodium, added sugar, and fat.  Reduce your daily sodium intake. Most people with hypertension should eat less than 1,500 mg of sodium a day.  Limit alcohol intake to no  more than 1 drink a day for nonpregnant women and 2 drinks a day for men. One drink equals 12 oz of beer, 5 oz of wine, or 1 oz of hard liquor. Lifestyle  Work with your health care provider to maintain a healthy body weight or to lose weight. Ask what an ideal weight is for you.  Get at least 30 minutes of exercise that causes your heart to beat faster (aerobic exercise) most days of the week. Activities may include walking, swimming, or biking.  Include exercise to strengthen your muscles (resistance exercise), such as pilates or lifting weights, as part of your weekly exercise routine. Try to do these types of exercises for 30 minutes at least 3 days a week.  Do not use any products that contain nicotine or tobacco, such as cigarettes and e-cigarettes. If you need help quitting, ask your health care provider.  Monitor your blood pressure at home as told by your health care provider.  Keep all follow-up visits as told by your health care provider. This is important. Medicines  Take over-the-counter and prescription medicines only as told by your health care provider. Follow directions carefully. Blood pressure medicines must be taken as prescribed.  Do not skip doses of blood pressure medicine. Doing this puts you at risk for problems and can make the medicine less effective.  Ask your health care provider about side effects or reactions to medicines that you should watch for. Contact a health care provider if:  You think you are having a reaction to a medicine you are taking.  You have headaches that keep coming back (recurring).  You feel dizzy.  You have swelling in your ankles.  You have trouble with your vision. Get help right away if:  You develop a severe headache or confusion.  You have unusual weakness or numbness.  You feel faint.  You have severe pain in your chest or abdomen.  You vomit repeatedly.  You have trouble breathing. Summary  Hypertension is  when the force of blood pumping through your arteries is too strong. If this condition is not controlled, it may put you at risk for serious complications.  Your personal target blood pressure may vary depending on your medical conditions, your age, and other factors. For most people, a normal blood pressure is less than 120/80.  Hypertension is treated with lifestyle changes, medicines, or a combination of both. Lifestyle changes include weight loss, eating a healthy, low-sodium diet, exercising more, and limiting alcohol. This information is not intended to replace advice given to you by your health care provider. Make sure you discuss any questions you have with your health care provider. Document Released: 05/23/2005 Document Revised: 04/20/2016 Document Reviewed: 04/20/2016 Elsevier Interactive Patient Education  2018 [employer].    IF you received an x-ray today, you will receive an invoice from [facility]. Please contact [facility] at [phone] with questions or concerns regarding your  invoice.   IF you received labwork today, you will receive an invoice from [place]. Please contact [facility] at [phone] with questions or concerns regarding your invoice.   Our billing staff will not be able to assist you with questions regarding bills from these companies.  You will be contacted with the lab results as soon as they are available. The fastest way to get your results is to activate your My Chart account. Instructions are located on the last page of this paperwork. If you have not heard from [place] regarding the results in 2 weeks, please contact this office.      I personally performed the services described in this documentation, which was scribed in my presence. The recorded information has been reviewed and considered for accuracy and completeness, addended by me as needed, and agree with information above.  Signed,   Meredith Staggers, MD Primary Care at  [facility].  07/28/17 5:20 PM

## 2017-07-28 NOTE — Patient Instructions (Addendum)
Restart meds at same doses, without missing doses. Keep a record of your blood pressures outside of the office and bring them to the next office visit in next 3 weeks to decide if med changes needed. Fasting for that visit to check cholesterol.  Walking for exercise if fine. See information on hypertension below.   Rash may be contact dermatitis from your soap or deodorant, or a type of eczema. Try the steroid cream to the darkened areas or rash twice per day for the next 2 weeks. Ok to use over the counter zyrtec or allegra once per day if itches, and use Aveeno lotion and a mild cleanser such as Cetaphil or Dove.  recheck in next 3 weeks.     Contact Dermatitis Dermatitis is redness, soreness, and swelling (inflammation) of the skin. Contact dermatitis is a reaction to certain substances that touch the skin. There are two types of contact dermatitis:  Irritant contact dermatitis. This type is caused by something that irritates your skin, such as dry hands from washing them too much. This type does not require previous exposure to the substance for a reaction to occur. This type is more common.  Allergic contact dermatitis. This type is caused by a substance that you are allergic to, such as a nickel allergy or poison ivy. This type only occurs if you have been exposed to the substance (allergen) before. Upon a repeat exposure, your body reacts to the substance. This type is less common.  What are the causes? Many different substances can cause contact dermatitis. Irritant contact dermatitis is most commonly caused by exposure to:  Makeup.  Soaps.  Detergents.  Bleaches.  Acids.  Metal salts, such as nickel.  Allergic contact dermatitis is most commonly caused by exposure to:  Poisonous plants.  Chemicals.  Jewelry.  Latex.  Medicines.  Preservatives in products, such as clothing.  What increases the risk? This condition is more likely to develop in:  People who have  jobs that expose them to irritants or allergens.  People who have certain medical conditions, such as asthma or eczema.  What are the signs or symptoms? Symptoms of this condition may occur anywhere on your body where the irritant has touched you or is touched by you. Symptoms include:  Dryness or flaking.  Redness.  Cracks.  Itching.  Pain or a burning feeling.  Blisters.  Drainage of small amounts of blood or clear fluid from skin cracks.  With allergic contact dermatitis, there may also be swelling in areas such as the eyelids, mouth, or genitals. How is this diagnosed? This condition is diagnosed with a medical history and physical exam. A patch skin test may be performed to help determine the cause. If the condition is related to your job, you may need to see an occupational medicine specialist. How is this treated? Treatment for this condition includes figuring out what caused the reaction and protecting your skin from further contact. Treatment may also include:  Steroid creams or ointments. Oral steroid medicines may be needed in more severe cases.  Antibiotics or antibacterial ointments, if a skin infection is present.  Antihistamine lotion or an antihistamine taken by mouth to ease itching.  A bandage (dressing).  Follow these instructions at home: Skin Care  Moisturize your skin as needed.  Apply cool compresses to the affected areas.  Try taking a bath with: ? Epsom salts. Follow the instructions on the packaging. You can get these at your local pharmacy or grocery store. ?  Baking soda. Pour a small amount into the bath as directed by your health care provider. ? Colloidal oatmeal. Follow the instructions on the packaging. You can get this at your local pharmacy or grocery store.  Try applying baking soda paste to your skin. Stir water into baking soda until it reaches a paste-like consistency.  Do not scratch your skin.  Bathe less frequently, such as  every other day.  Bathe in lukewarm water. Avoid using hot water. Medicines  Take or apply over-the-counter and prescription medicines only as told by your health care provider.  If you were prescribed an antibiotic medicine, take or apply your antibiotic as told by your health care provider. Do not stop using the antibiotic even if your condition starts to improve. General instructions  Keep all follow-up visits as told by your health care provider. This is important.  Avoid the substance that caused your reaction. If you do not know what caused it, keep a journal to try to track what caused it. Write down: ? What you eat. ? What cosmetic products you use. ? What you drink. ? What you wear in the affected area. This includes jewelry.  If you were given a dressing, take care of it as told by your health care provider. This includes when to change and remove it. Contact a health care provider if:  Your condition does not improve with treatment.  Your condition gets worse.  You have signs of infection such as swelling, tenderness, redness, soreness, or warmth in the affected area.  You have a fever.  You have new symptoms. Get help right away if:  You have a severe headache, neck pain, or neck stiffness.  You vomit.  You feel very sleepy.  You notice red streaks coming from the affected area.  Your bone or joint underneath the affected area becomes painful after the skin has healed.  The affected area turns darker.  You have difficulty breathing. This information is not intended to replace advice given to you by your health care provider. Make sure you discuss any questions you have with your health care provider. Document Released: 05/20/2000 Document Revised: 10/29/2015 Document Reviewed: 10/08/2014 Elsevier Interactive Patient Education  2018 [employer].   Hypertension Hypertension, commonly called high blood pressure, is when the force of blood pumping through  the arteries is too strong. The arteries are the blood vessels that carry blood from the heart throughout the body. Hypertension forces the heart to work harder to pump blood and may cause arteries to become narrow or stiff. Having untreated or uncontrolled hypertension can cause heart attacks, strokes, kidney disease, and other problems. A blood pressure reading consists of a higher number over a lower number. Ideally, your blood pressure should be below 120/80. The first ("top") number is called the systolic pressure. It is a measure of the pressure in your arteries as your heart beats. The second ("bottom") number is called the diastolic pressure. It is a measure of the pressure in your arteries as the heart relaxes. What are the causes? The cause of this condition is not known. What increases the risk? Some risk factors for high blood pressure are under your control. Others are not. Factors you can change  Smoking.  Having type 2 diabetes mellitus, high cholesterol, or both.  Not getting enough exercise or physical activity.  Being overweight.  Having too much fat, sugar, calories, or salt (sodium) in your diet.  Drinking too much alcohol. Factors that are difficult or  impossible to change  Having chronic kidney disease.  Having a family history of high blood pressure.  Age. Risk increases with age.  Race. You may be at higher risk if you are African-American.  Gender. Men are at higher risk than women before age 31. After age 32, women are at higher risk than men.  Having obstructive sleep apnea.  Stress. What are the signs or symptoms? Extremely high blood pressure (hypertensive crisis) may cause:  Headache.  Anxiety.  Shortness of breath.  Nosebleed.  Nausea and vomiting.  Severe chest pain.  Jerky movements you cannot control (seizures).  How is this diagnosed? This condition is diagnosed by measuring your blood pressure while you are seated, with your arm  resting on a surface. The cuff of the blood pressure monitor will be placed directly against the skin of your upper arm at the level of your heart. It should be measured at least twice using the same arm. Certain conditions can cause a difference in blood pressure between your right and left arms. Certain factors can cause blood pressure readings to be lower or higher than normal (elevated) for a short period of time:  When your blood pressure is higher when you are in a health care provider's office than when you are at home, this is called white coat hypertension. Most people with this condition do not need medicines.  When your blood pressure is higher at home than when you are in a health care provider's office, this is called masked hypertension. Most people with this condition may need medicines to control blood pressure.  If you have a high blood pressure reading during one visit or you have normal blood pressure with other risk factors:  You may be asked to return on a different day to have your blood pressure checked again.  You may be asked to monitor your blood pressure at home for 1 week or longer.  If you are diagnosed with hypertension, you may have other blood or imaging tests to help your health care provider understand your overall risk for other conditions. How is this treated? This condition is treated by making healthy lifestyle changes, such as eating healthy foods, exercising more, and reducing your alcohol intake. Your health care provider may prescribe medicine if lifestyle changes are not enough to get your blood pressure under control, and if:  Your systolic blood pressure is above 130.  Your diastolic blood pressure is above 80.  Your personal target blood pressure may vary depending on your medical conditions, your age, and other factors. Follow these instructions at home: Eating and drinking  Eat a diet that is high in fiber and potassium, and low in sodium,  added sugar, and fat. An example eating plan is called the DASH (Dietary Approaches to Stop Hypertension) diet. To eat this way: ? Eat plenty of fresh fruits and vegetables. Try to fill half of your plate at each meal with fruits and vegetables. ? Eat whole grains, such as whole wheat pasta, brown rice, or whole grain bread. Fill about one quarter of your plate with whole grains. ? Eat or drink low-fat dairy products, such as skim milk or low-fat yogurt. ? Avoid fatty cuts of meat, processed or cured meats, and poultry with skin. Fill about one quarter of your plate with lean proteins, such as fish, chicken without skin, beans, eggs, and tofu. ? Avoid premade and processed foods. These tend to be higher in sodium, added sugar, and fat.  Reduce your  daily sodium intake. Most people with hypertension should eat less than 1,500 mg of sodium a day.  Limit alcohol intake to no more than 1 drink a day for nonpregnant women and 2 drinks a day for men. One drink equals 12 oz of beer, 5 oz of wine, or 1 oz of hard liquor. Lifestyle  Work with your health care provider to maintain a healthy body weight or to lose weight. Ask what an ideal weight is for you.  Get at least 30 minutes of exercise that causes your heart to beat faster (aerobic exercise) most days of the week. Activities may include walking, swimming, or biking.  Include exercise to strengthen your muscles (resistance exercise), such as pilates or lifting weights, as part of your weekly exercise routine. Try to do these types of exercises for 30 minutes at least 3 days a week.  Do not use any products that contain nicotine or tobacco, such as cigarettes and e-cigarettes. If you need help quitting, ask your health care provider.  Monitor your blood pressure at home as told by your health care provider.  Keep all follow-up visits as told by your health care provider. This is important. Medicines  Take over-the-counter and prescription  medicines only as told by your health care provider. Follow directions carefully. Blood pressure medicines must be taken as prescribed.  Do not skip doses of blood pressure medicine. Doing this puts you at risk for problems and can make the medicine less effective.  Ask your health care provider about side effects or reactions to medicines that you should watch for. Contact a health care provider if:  You think you are having a reaction to a medicine you are taking.  You have headaches that keep coming back (recurring).  You feel dizzy.  You have swelling in your ankles.  You have trouble with your vision. Get help right away if:  You develop a severe headache or confusion.  You have unusual weakness or numbness.  You feel faint.  You have severe pain in your chest or abdomen.  You vomit repeatedly.  You have trouble breathing. Summary  Hypertension is when the force of blood pumping through your arteries is too strong. If this condition is not controlled, it may put you at risk for serious complications.  Your personal target blood pressure may vary depending on your medical conditions, your age, and other factors. For most people, a normal blood pressure is less than 120/80.  Hypertension is treated with lifestyle changes, medicines, or a combination of both. Lifestyle changes include weight loss, eating a healthy, low-sodium diet, exercising more, and limiting alcohol. This information is not intended to replace advice given to you by your health care provider. Make sure you discuss any questions you have with your health care provider. Document Released: 05/23/2005 Document Revised: 04/20/2016 Document Reviewed: 04/20/2016 Elsevier Interactive Patient Education  2018 [employer].    IF you received an x-ray today, you will receive an invoice from [facility]. Please contact [facility] at [phone] with questions or concerns regarding your invoice.    IF you received labwork today, you will receive an invoice from [facility]. Please contact [facility] at [phone] with questions or concerns regarding your invoice.   Our billing staff will not be able to assist you with questions regarding bills from these companies.  You will be contacted with the lab results as soon as they are available. The fastest way to get your results is to activate your My  Chart account. Instructions are located on the last page of this paperwork. If you have not heard from [place] regarding the results in 2 weeks, please contact this office.

## 2017-07-29 LAB — BASIC METABOLIC PANEL
BUN / CREAT RATIO: 20 (ref 9–20)
BUN: 22 mg/dL (ref 6–24)
CO2: 26 mmol/L (ref 20–29)
CREATININE: 1.11 mg/dL (ref 0.76–1.27)
Calcium: 9.8 mg/dL (ref 8.7–10.2)
Chloride: 95 mmol/L — ABNORMAL LOW (ref 96–106)
GFR calc Af Amer: 88 mL/min/{1.73_m2} (ref >59)
GFR calc non Af Amer: 76 mL/min/{1.73_m2} (ref >59)
GLUCOSE: 89 mg/dL (ref 65–99)
Potassium: 3.9 mmol/L (ref 3.5–5.2)
Sodium: 139 mmol/L (ref 134–144)

## 2017-08-05 ENCOUNTER — Encounter: Payer: Self-pay | Admitting: Radiology

## 2018-01-13 ENCOUNTER — Other Ambulatory Visit: Payer: Self-pay | Admitting: Family Medicine

## 2018-01-13 DIAGNOSIS — I10 Essential (primary) hypertension: Secondary | ICD-10-CM

## 2018-01-15 NOTE — Telephone Encounter (Signed)
Hydrochlorothiazide  refill Last Refill:07/28/17 # 90 and 1 refill Last OV: 07/28/17 PCP: Herma CarsonJ. Greene [facility]  Med pended per protocol

## 2018-02-22 ENCOUNTER — Other Ambulatory Visit: Payer: Self-pay | Admitting: Family Medicine

## 2018-02-22 DIAGNOSIS — I10 Essential (primary) hypertension: Secondary | ICD-10-CM

## 2018-02-22 NOTE — Telephone Encounter (Signed)
Attempted to call pt to schedule appt; VM not set up. Needs appt.

## 2018-02-22 NOTE — Telephone Encounter (Signed)
Refill of hydrodiuril  LOV 07/28/17 Dr. Neva [facility]atGreene  [facility]  #30  [facility]ills  [facility]  [address]   Attempted to reach pt to schedule appt; VM not set up.

## 2018-02-23 NOTE — Telephone Encounter (Signed)
30-day refill provided but stressed importance on that prescription he needs office visit.  Please schedule OV.

## 2018-02-23 NOTE — Telephone Encounter (Signed)
I have also attempted to call pt. Attempted to reach pt to schedule appt; VM not set up.   Please advise one refill.

## 2018-04-04 ENCOUNTER — Other Ambulatory Visit: Payer: Self-pay | Admitting: Family Medicine

## 2018-04-04 DIAGNOSIS — I10 Essential (primary) hypertension: Secondary | ICD-10-CM

## 2018-04-04 NOTE — Telephone Encounter (Signed)
Attempted to cal patient to schedule annual exam- voice mail not set up- note put on 30 day courtesy refill. Requested Prescriptions  Pending Prescriptions Disp Refills  . hydrochlorothiazide (HYDRODIURIL) 25 MG tablet [Pharmacy Med Name: HYDROCHLOROTHIAZIDE 25 MG TAB] 30 tablet 0    Sig: TAKE 1 TABLET BY MOUTH EVERY DAY. Needs appointment for additional refills.     Cardiovascular: Diuretics - Thiazide Failed - 04/04/2018  1:37 PM      Failed - Valid encounter within last 6 months    Recent Outpatient Visits          8 months ago Essential hypertension   [facility], [name], MD   1 year ago Acute bronchitis, unspecified organism   [facility], [name], MD   1 year ago Viral URI with cough   [facility], Newt Lukes, MD   2 years ago Great toe pain, right   [facility], Marolyn Hammock, PA-C   2 years ago Essential hypertension   [facility], [name], MD             Passed - Ca in normal range and within 360 days    Calcium  Date Value Ref Range Status  07/28/2017 9.8 8.7 - 10.2 mg/dL Final         Passed - Cr in normal range and within 360 days    Creat  Date Value Ref Range Status  12/20/2014 0.93 0.50 - 1.35 mg/dL Final   Creatinine, Ser  Date Value Ref Range Status  07/28/2017 1.11 0.76 - 1.27 mg/dL Final         Passed - K in normal range and within 360 days    Potassium  Date Value Ref Range Status  07/28/2017 3.9 3.5 - 5.2 mmol/L Final         Passed - Na in normal range and within 360 days    Sodium  Date Value Ref Range Status  07/28/2017 139 134 - 144 mmol/L Final         Passed - Last BP in normal range    BP Readings from Last 1 Encounters:  07/28/17 138/74

## 2018-04-07 ENCOUNTER — Other Ambulatory Visit: Payer: Self-pay | Admitting: Family Medicine

## 2018-04-07 DIAGNOSIS — I10 Essential (primary) hypertension: Secondary | ICD-10-CM

## 2018-04-09 NOTE — Telephone Encounter (Signed)
Patient called at the number listed, the person who answered the phone says it is the wrong number for the patient. Patient will need an appointment scheduled for medication refills.

## 2018-04-09 NOTE — Telephone Encounter (Signed)
Requested medication (s) are due for refill today: Yes  Requested medication (s) are on the active medication list: YEs  Last refill:  07/28/17  Future visit scheduled: No  Notes to clinic:  Patient is 6 months overdue for appointment, unable to refill per protocol due to this.     Requested Prescriptions  Pending Prescriptions Disp Refills   amLODipine (NORVASC) 10 MG tablet [Pharmacy Med Name: AMLODIPINE BESYLATE 10 MG TAB] 90 tablet 1    Sig: TAKE 1 TABLET BY MOUTH EVERY DAY     Cardiovascular:  Calcium Channel Blockers Failed - 04/09/2018 11:26 AM      Failed - Valid encounter within last 6 months    Recent Outpatient Visits          8 months ago Essential hypertension   [facility], [name], MD   1 year ago Acute bronchitis, unspecified organism   [facility], [name], MD   1 year ago Viral URI with cough   [facility], Newt Lukes, MD   2 years ago Great toe pain, right   [facility], Marolyn Hammock, PA-C   2 years ago Essential hypertension   [facility], [name], MD             Passed - Last BP in normal range    BP Readings from Last 1 Encounters:  07/28/17 138/74

## 2018-04-29 ENCOUNTER — Other Ambulatory Visit: Payer: Self-pay | Admitting: Family Medicine

## 2018-04-29 DIAGNOSIS — I10 Essential (primary) hypertension: Secondary | ICD-10-CM

## 2018-04-30 NOTE — Telephone Encounter (Signed)
[facility]. I advised according to the chart, a refill was sent and receipt confirmed on 04/13/18 #90/1 refill. Ankit says the only one received was on 04/04/18 #30/0 refills. He says he can take a verbal, I advised I am not able to give that, but will have it sent over electronically.   Patient called, left VM to return call to the office to schedule and OV with Dr. Neva SeatGreene. Patient is due for an OV before more refills medications, courtesy refill given on 04/04/18.

## 2018-04-30 NOTE — Telephone Encounter (Signed)
Requested medication (s) are due for refill today: Yes  Requested medication (s) are on the active medication list: Yes  Last refill:  04/04/18 (courtesy #30)  Future visit scheduled: No  Notes to clinic:  Unable to refill due to patient needs appointment and courtesy refill given.     Requested Prescriptions  Pending Prescriptions Disp Refills   hydrochlorothiazide (HYDRODIURIL) 25 MG tablet [Pharmacy Med Name: HYDROCHLOROTHIAZIDE 25 MG TAB] 30 tablet 0    Sig: TAKE 1 TABLET BY MOUTH EVERY DAY. Needs appointment for additional refills.     Cardiovascular: Diuretics - Thiazide Failed - 04/30/2018  3:24 PM      Failed - Valid encounter within last 6 months    Recent Outpatient Visits          9 months ago Essential hypertension   [facility], Asencion PartridgeJeffrey R, MD   1 year ago Acute bronchitis, unspecified organism   [facility], Asencion PartridgeJeffrey R, MD   1 year ago Viral URI with cough   [facility], Newt LukesJeffrey H, MD   2 years ago Great toe pain, right   [facility], Marolyn HammockMichael L, PA-C   2 years ago Essential hypertension   [facility], Asencion PartridgeJeffrey R, MD             Passed - Ca in normal range and within 360 days    Calcium  Date Value Ref Range Status  07/28/2017 9.8 8.7 - 10.2 mg/dL Final         Passed - Cr in normal range and within 360 days    Creat  Date Value Ref Range Status  12/20/2014 0.93 0.50 - 1.35 mg/dL Final   Creatinine, Ser  Date Value Ref Range Status  07/28/2017 1.11 0.76 - 1.27 mg/dL Final         Passed - K in normal range and within 360 days    Potassium  Date Value Ref Range Status  07/28/2017 3.9 3.5 - 5.2 mmol/L Final         Passed - Na in normal range and within 360 days    Sodium  Date Value Ref Range Status  07/28/2017 139 134 - 144 mmol/L Final         Passed - Last BP in normal range    BP Readings from Last 1 Encounters:  07/28/17 138/74

## 2018-06-04 ENCOUNTER — Other Ambulatory Visit: Payer: Self-pay | Admitting: Family Medicine

## 2018-06-04 DIAGNOSIS — I10 Essential (primary) hypertension: Secondary | ICD-10-CM

## 2018-06-04 NOTE — Telephone Encounter (Signed)
Courtesy refill for 11 tabs given until office visit on 06/15/18. Requested Prescriptions  Pending Prescriptions Disp Refills  . hydrochlorothiazide (HYDRODIURIL) 25 MG tablet [Pharmacy Med Name: HYDROCHLOROTHIAZIDE 25 MG TAB] 11 tablet 0    Sig: TAKE 1 TABLET BY MOUTH EVERY DAY. NEEDS APPOINTMENT FOR ADDITIONAL REFILLS.     Cardiovascular: Diuretics - Thiazide Failed - 06/04/2018 10:21 AM      Failed - Valid encounter within last 6 months    Recent Outpatient Visits          10 months ago Essential hypertension   [facility], Asencion PartridgeJeffrey R, MD   1 year ago Acute bronchitis, unspecified organism   [facility], Asencion PartridgeJeffrey R, MD   1 year ago Viral URI with cough   [facility], Newt LukesJeffrey H, MD   2 years ago Great toe pain, right   [facility], Marolyn HammockMichael L, PA-C   2 years ago Essential hypertension   [facility], Asencion PartridgeJeffrey R, MD      Future Appointments            In 1 week [facility] [place], Jeffrey R, [facility]           Passed - Ca in normal range and within 360 days    Calcium  Date Value Ref Range Status  07/28/2017 9.8 8.7 - 10.2 mg/dL Final         Passed - Cr in normal range and within 360 days    Creat  Date Value Ref Range Status  12/20/2014 0.93 0.50 - 1.35 mg/dL Final   Creatinine, Ser  Date Value Ref Range Status  07/28/2017 1.11 0.76 - 1.27 mg/dL Final         Passed - K in normal range and within 360 days    Potassium  Date Value Ref Range Status  07/28/2017 3.9 3.5 - 5.2 mmol/L Final         Passed - Na in normal range and within 360 days    Sodium  Date Value Ref Range Status  07/28/2017 139 134 - 144 mmol/L Final         Passed - Last BP in normal range    BP Readings from Last 1 Encounters:  07/28/17 138/74

## 2018-06-15 ENCOUNTER — Ambulatory Visit: Payer: BLUE CROSS/BLUE SHIELD | Admitting: Family Medicine

## 2018-07-03 ENCOUNTER — Ambulatory Visit: Payer: BLUE CROSS/BLUE SHIELD | Admitting: Family Medicine

## 2018-07-20 ENCOUNTER — Ambulatory Visit: Payer: BLUE CROSS/BLUE SHIELD | Admitting: Family Medicine

## 2018-07-22 ENCOUNTER — Other Ambulatory Visit: Payer: Self-pay | Admitting: Family Medicine

## 2018-07-22 DIAGNOSIS — I10 Essential (primary) hypertension: Secondary | ICD-10-CM

## 2018-07-23 NOTE — Telephone Encounter (Signed)
Copied from CRM [id number]. Topic: Quick Communication - Rx Refill/Question >> Jul 23, 2018 10:12 AM Retta Diones wrote: Medication: hydrochlorothiazide (HYDRODIURIL) 25 MG tablet  Has the patient contacted their pharmacy? Yes.   (Agent: If no, request that the patient contact the pharmacy for the refill.) (Agent: If yes, when and what did the pharmacy advise?)  Preferred Pharmacy (with phone number or street name): [id number] [place], [place] - [address] [phone] (Phone) [phone] (Fax)    Agent: Please be advised that RX refills may take up to 3 business days. We ask that you follow-up with your pharmacy.

## 2018-07-23 NOTE — Telephone Encounter (Signed)
Attempted to call patient to talk with him regarding how many pills (hydrochlorothiazide), he may need until his next appointment with Dr. [name]. No answer, left message for him to call back.

## 2018-07-23 NOTE — Telephone Encounter (Signed)
Pt called back and stated that he has 10 pills left of the hydrochlorothiazide. His appointment is 08/13/2018.

## 2018-07-23 NOTE — Telephone Encounter (Signed)
LVM for pt to call [facility] and let us know when he is going to be running out of meds. If we need to, we can send for a courtesy refill until he comes in on 08/13/18 with Dr. [name].  Please ask pt when he will  Be running ourt and if he is running out before the 3/9 appt, please send a CRM asking for a courtesy refill. Thank you!

## 2018-08-13 ENCOUNTER — Ambulatory Visit: Payer: BLUE CROSS/BLUE SHIELD | Admitting: Family Medicine

## 2018-08-16 ENCOUNTER — Other Ambulatory Visit: Payer: Self-pay

## 2018-08-16 ENCOUNTER — Encounter: Payer: Self-pay | Admitting: Family Medicine

## 2018-08-16 ENCOUNTER — Ambulatory Visit: Payer: BLUE CROSS/BLUE SHIELD | Admitting: Family Medicine

## 2018-08-16 ENCOUNTER — Ambulatory Visit
Admission: RE | Admit: 2018-08-16 | Discharge: 2018-08-16 | Disposition: A | Discharge status: Home / Self Care | Payer: BLUE CROSS/BLUE SHIELD | Source: Ambulatory Visit | Attending: Family Medicine | Admitting: Family Medicine

## 2018-08-16 VITALS — BP 146/90 | HR 126 | Temp 103.0°F | Resp 16 | Wt 162.2 lb

## 2018-08-16 DIAGNOSIS — R058 Other specified cough: Secondary | ICD-10-CM

## 2018-08-16 DIAGNOSIS — R05 Cough: Secondary | ICD-10-CM

## 2018-08-16 DIAGNOSIS — R509 Fever, unspecified: Secondary | ICD-10-CM

## 2018-08-16 LAB — CBC WITH DIFFERENTIAL/PLATELET
BASOS: 0 %
Basophils Absolute: 0 10*3/uL (ref 0.0–0.2)
EOS (ABSOLUTE): 0 10*3/uL (ref 0.0–0.4)
EOS: 0 %
Hematocrit: 41.6 % (ref 37.5–51.0)
Hemoglobin: 15 g/dL (ref 13.0–17.7)
LYMPHS ABS: 0.7 10*3/uL (ref 0.7–3.1)
Lymphs: 10 %
MCH: 31.8 pg (ref 26.6–33.0)
MCHC: 36.1 g/dL — AB (ref 31.5–35.7)
MCV: 88 fL (ref 79–97)
MONOS ABS: 0.9 10*3/uL (ref 0.1–0.9)
Monocytes: 13 %
NEUTROS ABS: 5.1 10*3/uL (ref 1.4–7.0)
Neutrophils: 77 %
PLATELETS: 236 10*3/uL (ref 150–450)
RBC: 4.71 x10E6/uL (ref 4.14–5.80)
RDW: 14 % (ref 11.6–15.4)
WBC: 6.8 10*3/uL (ref 3.4–10.8)

## 2018-08-16 MED ORDER — LEVOFLOXACIN 500 MG PO TABS: 500.0000 mg | ORAL_TABLET | Freq: Every day | ORAL | 0 refills | Status: DC

## 2018-08-16 NOTE — Progress Notes (Addendum)
   Subjective:    Patient ID: Juan Burton, male    DOB: 20-Feb-1966, 53 y.o.   MRN: [id number]  HPI He states that 2-1/2 weeks ago he developed fever, chills, myalgias and then 1 week ago he developed a cough that is now productive.  No shortness of breath, earache.  He does not smoke.  He does have underlying allergies and occasionally uses Zyrtec.  No known exposure to anybody with coronavirus.   Review of Systems     Objective:   Physical Exam Alert and in no distress.  Fever and pulse rate are recorded.  Tympanic membranes and canals are normal. Pharyngeal area is normal. Neck is supple without adenopathy or thyromegaly. Cardiac exam shows a regular sinus rhythm without murmurs or gallops. Lungs are clear to auscultation, not tachypneic.       Assessment & Plan:  Productive cough - Plan: CBC with Differential/Platelet, DG Chest 2 View, levofloxacin (LEVAQUIN) 500 MG tablet  Fever and chills - Plan: CBC with Differential/Platelet, DG Chest 2 View, levofloxacin (LEVAQUIN) 500 MG tablet

## 2018-08-16 NOTE — Patient Instructions (Signed)
Switch to either Claritin or Allegra and then also switch to Rhinocort nasal spray

## 2018-08-16 NOTE — Addendum Note (Signed)
Addended by: Ronnald Nian on: 08/16/2018 01:02 PM   Modules accepted: Orders

## 2018-08-21 ENCOUNTER — Other Ambulatory Visit: Payer: Self-pay | Admitting: Family Medicine

## 2018-08-21 DIAGNOSIS — I10 Essential (primary) hypertension: Secondary | ICD-10-CM

## 2018-08-24 ENCOUNTER — Ambulatory Visit (INDEPENDENT_AMBULATORY_CARE_PROVIDER_SITE_OTHER): Payer: BLUE CROSS/BLUE SHIELD | Admitting: Family Medicine

## 2018-08-24 ENCOUNTER — Encounter: Payer: Self-pay | Admitting: Family Medicine

## 2018-08-24 ENCOUNTER — Other Ambulatory Visit: Payer: Self-pay

## 2018-08-24 ENCOUNTER — Ambulatory Visit: Payer: BLUE CROSS/BLUE SHIELD | Admitting: Family Medicine

## 2018-08-24 VITALS — BP 114/64 | HR 105 | Temp 98.4°F | Ht 65.0 in | Wt 156.4 lb

## 2018-08-24 VITALS — BP 124/78 | HR 89 | Temp 98.4°F | Wt 156.0 lb

## 2018-08-24 DIAGNOSIS — I10 Essential (primary) hypertension: Secondary | ICD-10-CM | POA: Diagnosis not present

## 2018-08-24 DIAGNOSIS — J189 Pneumonia, unspecified organism: Secondary | ICD-10-CM | POA: Diagnosis not present

## 2018-08-24 DIAGNOSIS — E785 Hyperlipidemia, unspecified: Secondary | ICD-10-CM

## 2018-08-24 LAB — COMPREHENSIVE METABOLIC PANEL
ALBUMIN: 4.7 g/dL (ref 3.8–4.9)
ALT: 103 IU/L — ABNORMAL HIGH (ref 0–44)
AST: 49 IU/L — ABNORMAL HIGH (ref 0–40)
Albumin/Globulin Ratio: 1.4 (ref 1.2–2.2)
Alkaline Phosphatase: 105 IU/L (ref 39–117)
BUN/Creatinine Ratio: 15 (ref 9–20)
BUN: 16 mg/dL (ref 6–24)
Bilirubin Total: 0.5 mg/dL (ref 0.0–1.2)
CO2: 27 mmol/L (ref 20–29)
Calcium: 10.1 mg/dL (ref 8.7–10.2)
Chloride: 98 mmol/L (ref 96–106)
Creatinine, Ser: 1.06 mg/dL (ref 0.76–1.27)
GFR calc Af Amer: 92 mL/min/{1.73_m2} (ref >59)
GFR calc non Af Amer: 80 mL/min/{1.73_m2} (ref >59)
Globulin, Total: 3.3 g/dL (ref 1.5–4.5)
Glucose: 101 mg/dL — ABNORMAL HIGH (ref 65–99)
POTASSIUM: 4 mmol/L (ref 3.5–5.2)
Sodium: 141 mmol/L (ref 134–144)
Total Protein: 8 g/dL (ref 6.0–8.5)

## 2018-08-24 LAB — LIPID PANEL
Chol/HDL Ratio: 6 ratio — ABNORMAL HIGH (ref 0.0–5.0)
Cholesterol, Total: 199 mg/dL (ref 100–199)
HDL: 33 mg/dL — ABNORMAL LOW (ref >39)
LDL Calculated: 140 mg/dL — ABNORMAL HIGH (ref 0–99)
Triglycerides: 132 mg/dL (ref 0–149)
VLDL Cholesterol Cal: 26 mg/dL (ref 5–40)

## 2018-08-24 MED ORDER — HYDROCHLOROTHIAZIDE 25 MG PO TABS: ORAL_TABLET | ORAL | 2 refills | Status: DC

## 2018-08-24 MED ORDER — AMLODIPINE BESYLATE 10 MG PO TABS: 10.0000 mg | ORAL_TABLET | Freq: Every day | ORAL | 2 refills | Status: DC

## 2018-08-24 NOTE — Progress Notes (Signed)
   Subjective:    Patient ID: Juan Burton, male    DOB: Apr 18, 1966, 53 y.o.   MRN: [id number]  HPI He is here for recheck.  He states that he is at least 95% better and only occasionally having a cough.   Review of Systems     Objective:   Physical Exam Alert and in no distress.  Lungs are clear to auscultation.       Assessment & Plan:  Pneumonia due to infectious organism, unspecified laterality, unspecified part of lung He is to return here as needed.  Did recommend that he choose one primary care as opposed to coming to 2 different ones.

## 2018-08-24 NOTE — Patient Instructions (Addendum)
No change in meds for now.  Let me know when I can refer you for colonoscopy.   If you have lab work done today you will be contacted with your lab results within the next 2 weeks.  If you have not heard from [place] then please contact us. The fastest way to get your results is to register for My Chart.   IF you received an x-ray today, you will receive an invoice from [facility]. Please contact [facility] at [phone] with questions or concerns regarding your invoice.   IF you received labwork today, you will receive an invoice from [place]. Please contact [facility] at [phone] with questions or concerns regarding your invoice.   Our billing staff will not be able to assist you with questions regarding bills from these companies.  You will be contacted with the lab results as soon as they are available. The fastest way to get your results is to activate your My Chart account. Instructions are located on the last page of this paperwork. If you have not heard from [place] regarding the results in 2 weeks, please contact this office.

## 2018-08-24 NOTE — Progress Notes (Signed)
Subjective:    Patient ID: Juan Burton, male    DOB: [birth year], 53 y.o.   MRN: [id number]  HPI Juan Burton is a 53 y.o. male Presents today for: Chief Complaint  Patient presents with  . Hypertension    F/U    Hypertension: BP Readings from Last 3 Encounters:  08/24/18 114/64  08/16/18 (!) 146/90  07/28/17 138/74   Lab Results  Component Value Date   CREATININE 1.11 07/28/2017  last appt 07/2017.  norvasc  qd, hctz  qd.  No new side effects with meds.  Fasting today, except chocolate milk around 6-7am.   Hyperlipidemia:  Lab Results  Component Value Date   CHOL 192 02/11/2016   HDL 38 (L) 02/11/2016   LDLCALC 136 (H) 02/11/2016   TRIG 90 02/11/2016   CHOLHDL 5.1 02/11/2016   Lab Results  Component Value Date   ALT 17 12/20/2014   AST 17 12/20/2014   ALKPHOS 80 12/20/2014   BILITOT 0.9 12/20/2014  The 10-year ASCVD risk score Denman George DC Jr., et al., 2013) is: 8.7%   Values used to calculate the score:     Age: 29 years     Sex: Male     Is Non-Hispanic African American: Yes     Diabetic: No     Tobacco smoker: No     Systolic Blood Pressure: 114 mmHg     Is BP treated: Yes     HDL Cholesterol: 38 mg/dL     Total Cholesterol: 192 mg/dL   Exercise: no regular exercise.   Treated with levaquin for cough on 3/12. Improved. Off meds now.   HM:  Declines colonoscopy, or cologuard.   Patient Active Problem List   Diagnosis Date Noted  . Chest pain 02/10/2016  . Essential hypertension 08/22/2013   Past Medical History:  Diagnosis Date  . Hypertension    Past Surgical History:  Procedure Laterality Date  . NO PAST SURGERIES     No Known Allergies Prior to Admission medications   Medication Sig Start Date End Date Taking? Authorizing Provider  amLODipine (NORVASC) 10 MG tablet TAKE 1 TABLET BY MOUTH EVERY DAY 04/13/18  Yes Shade Flood, MD  aspirin 81 MG EC tablet Take 1 tablet (81 mg total) by mouth daily. 03/14/16  Yes Shade Flood, MD  hydrochlorothiazide (HYDRODIURIL) 25 MG tablet TAKE 1 TABLET BY MOUTH EVERY DAY *NEED APPOINTMENT* 07/24/18  Yes Myles Lipps, MD   Social History   Socioeconomic History  . Marital status: Single    Spouse name: Not on file  . Number of children: Not on file  . Years of education: Not on file  . Highest education level: Not on file  Occupational History  . Not on file  Social Needs  . Financial resource strain: Not on file  . Food insecurity:    Worry: Not on file    Inability: Not on file  . Transportation needs:    Medical: Not on file    Non-medical: Not on file  Tobacco Use  . Smoking status: Never Smoker  . Smokeless tobacco: Never Used  Substance and Sexual Activity  . Alcohol use: No  . Drug use: No  . Sexual activity: Never    Birth control/protection: Abstinence  Lifestyle  . Physical activity:    Days per week: Not on file    Minutes per session: Not on file  . Stress: Not on file  Relationships  . Social connections:  Talks on phone: Not on file    Gets together: Not on file    Attends religious service: Not on file    Active member of club or organization: Not on file    Attends meetings of clubs or organizations: Not on file    Relationship status: Not on file  . Intimate partner violence:    Fear of current or ex partner: Not on file    Emotionally abused: Not on file    Physically abused: Not on file    Forced sexual activity: Not on file  Other Topics Concern  . Not on file  Social History Narrative  . Not on file    Review of Systems  Constitutional: Negative for fatigue and unexpected weight change.  Eyes: Negative for visual disturbance.  Respiratory: Negative for cough, chest tightness and shortness of breath.   Cardiovascular: Negative for chest pain, palpitations and leg swelling.  Gastrointestinal: Negative for abdominal pain and blood in stool.  Neurological: Negative for dizziness, light-headedness and headaches.        Objective:   Physical Exam Vitals signs reviewed.  Constitutional:      Appearance: He is well-developed.  HENT:     Head: Normocephalic and atraumatic.  Eyes:     Pupils: Pupils are equal, round, and reactive to light.  Neck:     Vascular: No carotid bruit or JVD.  Cardiovascular:     Rate and Rhythm: Normal rate and regular rhythm.     Heart sounds: Normal heart sounds. No murmur.  Pulmonary:     Effort: Pulmonary effort is normal.     Breath sounds: Normal breath sounds. No rales.  Skin:    General: Skin is warm and dry.  Neurological:     Mental Status: He is alert and oriented to person, place, and time.    Vitals:   08/24/18 1037  BP: 114/64  Pulse: (!) 105  Temp: 98.4 F (36.9 C)  TempSrc: Oral  SpO2: 97%  Weight: 156 lb 6.4 oz (70.9 kg)  Height: 5\' 5"  (1.651 m)        Assessment & Plan:   Juan Burton is a 53 y.o. male Hyperlipidemia, unspecified hyperlipidemia type - Plan: Comprehensive metabolic panel, Lipid panel  -Borderline ASCVD risk based on prior readings.  Discussed diet, activity approach initially, repeat labs to determine if changes needed.  Consider repeat testing in 6 months after lifestyle modification.  Essential hypertension - Plan: Comprehensive metabolic panel, amLODipine (NORVASC) 10 MG tablet, hydrochlorothiazide (HYDRODIURIL) 25 MG tablet  -Stable, check labs, continue same regimen.  Health maintenance Colon cancer screening discussed including options with colonoscopy or Cologuard.  He would like to think about this further, declined both at present.  Advised to let me know when I can refer him for either one.   Meds ordered this encounter  Medications  . amLODipine (NORVASC) 10 MG tablet    Sig: Take 1 tablet (10 mg total) by mouth daily.    Dispense:  90 tablet    Refill:  2    DX Code Needed  .  . hydrochlorothiazide (HYDRODIURIL) 25 MG tablet    Sig: TAKE 1 TABLET BY MOUTH EVERY DAY    Dispense:  90 tablet     Refill:  2   Patient Instructions   No change in meds for now.  Let me know when I can refer you for colonoscopy.   If you have lab work done today you will be contacted with  your lab results within the next 2 weeks.  If you have not heard from [place] then please contact us. The fastest way to get your results is to register for My Chart.   IF you received an x-ray today, you will receive an invoice from [facility]. Please contact [facility] at [phone] with questions or concerns regarding your invoice.   IF you received labwork today, you will receive an invoice from [place]. Please contact [facility] at [phone] with questions or concerns regarding your invoice.   Our billing staff will not be able to assist you with questions regarding bills from these companies.  You will be contacted with the lab results as soon as they are available. The fastest way to get your results is to activate your My Chart account. Instructions are located on the last page of this paperwork. If you have not heard from [place] regarding the results in 2 weeks, please contact this office.       Signed,   Meredith Staggers, MD [facility] at [facility].  08/26/18 11:00 PM

## 2018-08-27 ENCOUNTER — Telehealth: Payer: Self-pay | Admitting: Family Medicine

## 2018-08-27 ENCOUNTER — Encounter: Payer: Self-pay | Admitting: Family Medicine

## 2018-08-27 NOTE — Telephone Encounter (Signed)
Give him a note 

## 2018-08-27 NOTE — Telephone Encounter (Signed)
Note faxed.

## 2018-08-27 NOTE — Telephone Encounter (Signed)
Pt states needs a note releasing him back to work today, please fax to #[phone] or [phone]

## 2018-08-29 ENCOUNTER — Other Ambulatory Visit: Payer: Self-pay | Admitting: Family Medicine

## 2018-08-29 DIAGNOSIS — R7989 Other specified abnormal findings of blood chemistry: Secondary | ICD-10-CM

## 2018-08-29 DIAGNOSIS — R945 Abnormal results of liver function studies: Principal | ICD-10-CM

## 2018-08-29 NOTE — Progress Notes (Signed)
See lab notes. Elevated lft's.

## 2018-08-31 ENCOUNTER — Telehealth: Payer: Self-pay | Admitting: Family Medicine

## 2018-08-31 NOTE — Telephone Encounter (Signed)
Pt given results per notes of Dr [name] on 08/29/18.Unable to document in result note due to result note not being routed to [facility]. Called [facility] and was asked to set up lab appt under a nurse visit. Pt given an appt for 09/07/18 for blood work. Called pt and updated on no Saturday lab appts and notified him a lab appt was scheduled  For 09/07/18 per pt's request.

## 2018-08-31 NOTE — Telephone Encounter (Signed)
Copied from CRM [id number]. Topic: Quick Communication - See Telephone Encounter >> Aug 31, 2018  8:59 AM Jens Som A wrote: CRM for notification. See Telephone encounter for: 08/31/18  Patient states that he is returning a call for lab results. Advised to place a Crm in Thank you.

## 2018-09-07 ENCOUNTER — Ambulatory Visit: Payer: Self-pay

## 2018-11-30 ENCOUNTER — Ambulatory Visit: Payer: BLUE CROSS/BLUE SHIELD | Admitting: Family Medicine

## 2019-02-25 ENCOUNTER — Telehealth: Payer: Self-pay | Admitting: Family Medicine

## 2019-02-25 NOTE — Telephone Encounter (Signed)
Received fax that stated patient wanted to make appt, did not specify why. LVM to schedule appt.

## 2019-03-22 ENCOUNTER — Ambulatory Visit: Payer: BC Managed Care – PPO | Admitting: Family Medicine

## 2019-06-02 ENCOUNTER — Other Ambulatory Visit: Payer: Self-pay | Admitting: Family Medicine

## 2019-06-02 DIAGNOSIS — I10 Essential (primary) hypertension: Secondary | ICD-10-CM

## 2019-06-02 NOTE — Telephone Encounter (Signed)
Forwarding medication refill request to the clinical pool for review. 

## 2019-08-31 ENCOUNTER — Other Ambulatory Visit: Payer: Self-pay | Admitting: Family Medicine

## 2019-08-31 DIAGNOSIS — I10 Essential (primary) hypertension: Secondary | ICD-10-CM

## 2019-08-31 NOTE — Telephone Encounter (Signed)
Requested  medications are  due for refill today yes  Requested medications are on the active medication list yes  Last refill 12/28  Future visit scheduled no  Last visit 08/2018  Notes to clinic Failed protocol due to 1 year since last visit

## 2019-09-25 ENCOUNTER — Other Ambulatory Visit: Payer: Self-pay | Admitting: Family Medicine

## 2019-09-25 DIAGNOSIS — I10 Essential (primary) hypertension: Secondary | ICD-10-CM

## 2019-10-09 ENCOUNTER — Other Ambulatory Visit: Payer: Self-pay | Admitting: Family Medicine

## 2019-10-09 DIAGNOSIS — I10 Essential (primary) hypertension: Secondary | ICD-10-CM

## 2019-10-09 NOTE — Telephone Encounter (Signed)
Patient/Pharmacy requesting changes to Rx sent in as #[phone] RF- sent to office for review of request

## 2019-12-13 ENCOUNTER — Telehealth: Payer: Self-pay | Admitting: Family Medicine

## 2020-03-02 ENCOUNTER — Other Ambulatory Visit: Payer: Self-pay | Admitting: Family Medicine

## 2020-03-02 ENCOUNTER — Other Ambulatory Visit: Payer: Self-pay

## 2020-03-02 DIAGNOSIS — I10 Essential (primary) hypertension: Secondary | ICD-10-CM

## 2020-03-02 NOTE — Telephone Encounter (Signed)
Requested medication (s) are due for refill today: Yes  Requested medication (s) are on the active medication list: Yes  Last refill:  06/03/19  Future visit scheduled: No  Notes to clinic:  Dr. Joslyn Devon listed as PCP.    Requested Prescriptions  Pending Prescriptions Disp Refills   hydrochlorothiazide (HYDRODIURIL) 25 MG tablet [Pharmacy Med Name: HYDROCHLOROTHIAZIDE 25 MG TAB] 90 tablet 2    Sig: TAKE 1 TABLET BY MOUTH EVERY DAY      Cardiovascular: Diuretics - Thiazide Failed - 03/02/2020  9:45 AM      Failed - Ca in normal range and within 360 days    Calcium  Date Value Ref Range Status  08/24/2018 10.1 8.7 - 10.2 mg/dL Final          Failed - Cr in normal range and within 360 days    Creat  Date Value Ref Range Status  12/20/2014 0.93 0.50 - 1.35 mg/dL Final   Creatinine, Ser  Date Value Ref Range Status  08/24/2018 1.06 0.76 - 1.27 mg/dL Final          Failed - K in normal range and within 360 days    Potassium  Date Value Ref Range Status  08/24/2018 4.0 3.5 - 5.2 mmol/L Final          Failed - Na in normal range and within 360 days    Sodium  Date Value Ref Range Status  08/24/2018 141 134 - 144 mmol/L Final          Failed - Valid encounter within last 6 months    Recent Outpatient Visits           1 year ago Hyperlipidemia, unspecified hyperlipidemia type   [facility], [name], MD   2 years ago Essential hypertension   [facility], [name], MD   3 years ago Acute bronchitis, unspecified organism   [facility], [name], MD   3 years ago Viral URI with cough   [facility], Newt Lukes, MD   3 years ago Great toe pain, right   [facility] at [facility], Marolyn Hammock, PA-C              Passed - Last BP in normal range    BP Readings from Last 1 Encounters:  08/24/18 124/78

## 2020-03-28 ENCOUNTER — Other Ambulatory Visit: Payer: Self-pay | Admitting: Family Medicine

## 2020-03-28 DIAGNOSIS — I10 Essential (primary) hypertension: Secondary | ICD-10-CM

## 2020-04-14 ENCOUNTER — Other Ambulatory Visit: Payer: Self-pay

## 2020-04-14 ENCOUNTER — Telehealth: Payer: Self-pay | Admitting: Family Medicine

## 2020-04-14 ENCOUNTER — Other Ambulatory Visit: Payer: Self-pay | Admitting: Family Medicine

## 2020-04-14 DIAGNOSIS — I10 Essential (primary) hypertension: Secondary | ICD-10-CM

## 2020-04-14 MED ORDER — AMLODIPINE BESYLATE 10 MG PO TABS: 10.0000 mg | ORAL_TABLET | Freq: Every day | ORAL | 0 refills | Status: DC

## 2020-04-14 NOTE — Telephone Encounter (Signed)
   Notes to clinic:  Patient requesting a 90 day supply  Review for change     Requested Prescriptions  Pending Prescriptions Disp Refills   amLODipine (NORVASC) 10 MG tablet [Pharmacy Med Name: AMLODIPINE BESYLATE 10 MG TAB] 90 tablet 1    Sig: TAKE 1 TABLET BY MOUTH EVERY DAY      Cardiovascular:  Calcium Channel Blockers Failed - 04/14/2020 10:13 AM      Failed - Valid encounter within last 6 months    Recent Outpatient Visits           1 year ago Hyperlipidemia, unspecified hyperlipidemia type   [facility], [name], MD   2 years ago Essential hypertension   [facility], [name], MD   3 years ago Acute bronchitis, unspecified organism   [facility], [name], MD   3 years ago Viral URI with cough   [facility], Newt Lukes, MD   4 years ago Great toe pain, right   [facility], Marolyn Hammock, PA-C       Future Appointments             In 3 weeks Shade Flood, MD [facility] [place], [facility]            Passed - Last BP in normal range    BP Readings from Last 1 Encounters:  08/24/18 124/78

## 2020-04-14 NOTE — Telephone Encounter (Signed)
Sent!

## 2020-04-14 NOTE — Telephone Encounter (Signed)
Pt called to have his high pb checked. Pt is wanting to see if he can have a curtesy refill on his medication until he can come for his appt on 05/06/20. Please advise.

## 2020-04-23 ENCOUNTER — Ambulatory Visit: Payer: BC Managed Care – PPO | Admitting: Family Medicine

## 2020-04-23 ENCOUNTER — Other Ambulatory Visit: Payer: Self-pay

## 2020-04-23 ENCOUNTER — Encounter: Payer: Self-pay | Admitting: Family Medicine

## 2020-04-23 VITALS — BP 132/84 | HR 92 | Temp 98.1°F | Wt 164.4 lb

## 2020-04-23 DIAGNOSIS — E785 Hyperlipidemia, unspecified: Secondary | ICD-10-CM

## 2020-04-23 DIAGNOSIS — Z1211 Encounter for screening for malignant neoplasm of colon: Secondary | ICD-10-CM

## 2020-04-23 DIAGNOSIS — I10 Essential (primary) hypertension: Secondary | ICD-10-CM | POA: Diagnosis not present

## 2020-04-23 DIAGNOSIS — Z2821 Immunization not carried out because of patient refusal: Secondary | ICD-10-CM | POA: Diagnosis not present

## 2020-04-23 LAB — CBC WITH DIFFERENTIAL/PLATELET
Lymphocytes Absolute: 2.2 10*3/uL (ref 0.7–3.1)
Monocytes Absolute: 0.5 10*3/uL (ref 0.1–0.9)

## 2020-04-23 MED ORDER — HYDROCHLOROTHIAZIDE 12.5 MG PO CAPS: 12.5000 mg | ORAL_CAPSULE | Freq: Every day | ORAL | 3 refills | Status: DC

## 2020-04-23 NOTE — Progress Notes (Signed)
° °  Subjective:    Patient ID: Juan Burton, male    DOB: [birth year], 54 y.o.   MRN: [id number]  HPI He is here to establish care with me.  He does have hypertension and is taking amlodipine as well as HCTZ at 25 mg.  He is not having any difficulty with that.  He does complain of some nocturia but no hesitancy or decreased stream.  He has no family history of colon cancer or colon polyps.  His work keeps him busy physically.   Review of Systems     Objective:   Physical Exam Alert and in no distress. Tympanic membranes and canals are normal. Pharyngeal area is normal. Neck is supple without adenopathy or thyromegaly. Cardiac exam shows a regular sinus rhythm without murmurs or gallops. Lungs are clear to auscultation.        Assessment & Plan:  Essential hypertension - Plan: hydrochlorothiazide (MICROZIDE) 12.5 MG capsule, CBC with Differential/Platelet, Comprehensive metabolic panel  Hyperlipidemia, unspecified hyperlipidemia type - Plan: Lipid panel  Screening for colon cancer - Plan: Cologuard  Immunization refused I discussed immunizations including Covid vaccine and he is not interested.  He will continue on his present medication regimen.  Discussed colon cancer screening and will order the Cologuard. We will also decrease his hydrochlorothiazide to 12.5 mg.

## 2020-04-24 LAB — COMPREHENSIVE METABOLIC PANEL
ALT: 30 IU/L (ref 0–44)
AST: 19 IU/L (ref 0–40)
Albumin/Globulin Ratio: 1.5 (ref 1.2–2.2)
Albumin: 4.9 g/dL (ref 3.8–4.9)
Alkaline Phosphatase: 119 IU/L (ref 44–121)
BUN/Creatinine Ratio: 15 (ref 9–20)
BUN: 17 mg/dL (ref 6–24)
Bilirubin Total: 0.5 mg/dL (ref 0.0–1.2)
CO2: 29 mmol/L (ref 20–29)
Calcium: 9.6 mg/dL (ref 8.7–10.2)
Chloride: 99 mmol/L (ref 96–106)
Creatinine, Ser: 1.16 mg/dL (ref 0.76–1.27)
GFR calc Af Amer: 82 mL/min/{1.73_m2} (ref >59)
GFR calc non Af Amer: 71 mL/min/{1.73_m2} (ref >59)
Globulin, Total: 3.2 g/dL (ref 1.5–4.5)
Glucose: 90 mg/dL (ref 65–99)
Potassium: 3.7 mmol/L (ref 3.5–5.2)
Sodium: 143 mmol/L (ref 134–144)
Total Protein: 8.1 g/dL (ref 6.0–8.5)

## 2020-04-24 LAB — CBC WITH DIFFERENTIAL/PLATELET
Basophils Absolute: 0 10*3/uL (ref 0.0–0.2)
Basos: 1 %
EOS (ABSOLUTE): 0.1 10*3/uL (ref 0.0–0.4)
Eos: 1 %
Hematocrit: 42.3 % (ref 37.5–51.0)
Hemoglobin: 15.1 g/dL (ref 13.0–17.7)
Immature Grans (Abs): 0 10*3/uL (ref 0.0–0.1)
Immature Granulocytes: 0 %
Lymphs: 35 %
MCH: 31.3 pg (ref 26.6–33.0)
MCHC: 35.7 g/dL (ref 31.5–35.7)
MCV: 88 fL (ref 79–97)
Monocytes: 8 %
Neutrophils Absolute: 3.4 10*3/uL (ref 1.4–7.0)
Neutrophils: 55 %
Platelets: 290 10*3/uL (ref 150–450)
RBC: 4.82 x10E6/uL (ref 4.14–5.80)
RDW: 12.1 % (ref 11.6–15.4)
WBC: 6.2 10*3/uL (ref 3.4–10.8)

## 2020-04-24 LAB — LIPID PANEL
Chol/HDL Ratio: 5 ratio (ref 0.0–5.0)
Cholesterol, Total: 200 mg/dL — ABNORMAL HIGH (ref 100–199)
HDL: 40 mg/dL (ref >39)
LDL Chol Calc (NIH): 139 mg/dL — ABNORMAL HIGH (ref 0–99)
Triglycerides: 116 mg/dL (ref 0–149)
VLDL Cholesterol Cal: 21 mg/dL (ref 5–40)

## 2020-05-06 ENCOUNTER — Ambulatory Visit: Payer: Self-pay | Admitting: Family Medicine

## 2020-08-04 ENCOUNTER — Other Ambulatory Visit: Payer: Self-pay

## 2020-08-04 ENCOUNTER — Encounter: Payer: Self-pay | Admitting: Family Medicine

## 2020-08-04 ENCOUNTER — Ambulatory Visit: Payer: BC Managed Care – PPO | Admitting: Family Medicine

## 2020-08-04 VITALS — BP 138/82 | HR 85 | Temp 97.9°F | Ht 65.5 in | Wt 150.2 lb

## 2020-08-04 DIAGNOSIS — Z1159 Encounter for screening for other viral diseases: Secondary | ICD-10-CM

## 2020-08-04 DIAGNOSIS — Z2821 Immunization not carried out because of patient refusal: Secondary | ICD-10-CM

## 2020-08-04 DIAGNOSIS — I10 Essential (primary) hypertension: Secondary | ICD-10-CM

## 2020-08-04 DIAGNOSIS — E785 Hyperlipidemia, unspecified: Secondary | ICD-10-CM | POA: Diagnosis not present

## 2020-08-04 DIAGNOSIS — Z0000 Encounter for general adult medical examination without abnormal findings: Secondary | ICD-10-CM

## 2020-08-04 DIAGNOSIS — Z1211 Encounter for screening for malignant neoplasm of colon: Secondary | ICD-10-CM

## 2020-08-04 LAB — POCT URINALYSIS DIP (PROADVANTAGE DEVICE)
Bilirubin, UA: NEGATIVE
Blood, UA: NEGATIVE
Glucose, UA: NEGATIVE mg/dL
Ketones, POC UA: NEGATIVE mg/dL
Leukocytes, UA: NEGATIVE
Nitrite, UA: NEGATIVE
Specific Gravity, Urine: 1.015
Urobilinogen, Ur: 1
pH, UA: 7 (ref 5.0–8.0)

## 2020-08-04 MED ORDER — LOSARTAN POTASSIUM-HCTZ 100-12.5 MG PO TABS: 1.0000 | ORAL_TABLET | Freq: Every day | ORAL | 3 refills | Status: DC

## 2020-08-04 MED ORDER — AMLODIPINE BESYLATE 10 MG PO TABS: 10.0000 mg | ORAL_TABLET | Freq: Every day | ORAL | 0 refills | Status: DC

## 2020-08-04 NOTE — Progress Notes (Addendum)
   Subjective:    Patient ID: Juan Burton, male    DOB: December 07, 1965, 55 y.o.   MRN: [id number]  HPI He is here for complete examination.  He had a recent URI and still having a slight cough but he states this is slowly getting better.  He continues on HCTZ as well as amlodipine for his blood pressure.  He also takes a baby aspirin daily.  He does have underlying allergies and treats them with OTC medications.  He is not interested in the Covid vaccine.  He does have blood work does show hyperlipidemia.  His work and home life are going well.  Family and social history as well as health maintenance and immunizations was reviewed   Review of Systems  All other systems reviewed and are negative.      Objective:   Physical Exam Alert and in no distress. Tympanic membranes and canals are normal. Pharyngeal area is normal. Neck is supple without adenopathy or thyromegaly. Cardiac exam shows a regular sinus rhythm without murmurs or gallops. Lungs are clear to auscultation.  Abdominal exam shows no masses or tenderness with normal bowel sounds.        Assessment & Plan:  Routine general medical examination at a health care facility  Essential hypertension - Plan: POCT Urinalysis DIP (Proadvantage Device), losartan-hydrochlorothiazide (HYZAAR) 100-12.5 MG tablet, amLODipine (NORVASC) 10 MG tablet  Routine medical exam - Plan: POCT Urinalysis DIP (Proadvantage Device)  Hyperlipidemia, unspecified hyperlipidemia type  Screening for colon cancer - Plan: Cologuard  Immunization refused  Need for hepatitis C screening test - Plan: Hepatitis C antibody Discussed adding another drug to his regimen will therefore switch him to losartan/HCTZ and continue on amlodipine.  Recheck here in about 1 month. I did discuss the use of aspirin and recommend stopping it.  Recheck here in 1 month.

## 2020-08-05 ENCOUNTER — Other Ambulatory Visit: Payer: Self-pay

## 2020-08-05 DIAGNOSIS — I10 Essential (primary) hypertension: Secondary | ICD-10-CM

## 2020-08-05 LAB — HEPATITIS C ANTIBODY: Hep C Virus Ab: <0.1 s/co ratio (ref 0.0–0.9)

## 2020-08-05 MED ORDER — LOSARTAN POTASSIUM-HCTZ 100-12.5 MG PO TABS: 1.0000 | ORAL_TABLET | Freq: Every day | ORAL | 3 refills | Status: DC

## 2020-09-01 ENCOUNTER — Other Ambulatory Visit: Payer: Self-pay | Admitting: Family Medicine

## 2020-09-01 ENCOUNTER — Ambulatory Visit: Payer: BC Managed Care – PPO | Admitting: Family Medicine

## 2020-09-01 DIAGNOSIS — I10 Essential (primary) hypertension: Secondary | ICD-10-CM

## 2020-09-04 ENCOUNTER — Ambulatory Visit: Payer: BC Managed Care – PPO | Admitting: Family Medicine

## 2021-01-10 IMAGING — CR CHEST - 2 VIEW
2 series · 2 of 2 positions shown · non-contrast
Comparison: 02/10/2016

CLINICAL DATA: Cough and fever over the last week.

EXAM:
CHEST - 2 VIEW

[w chest pa]
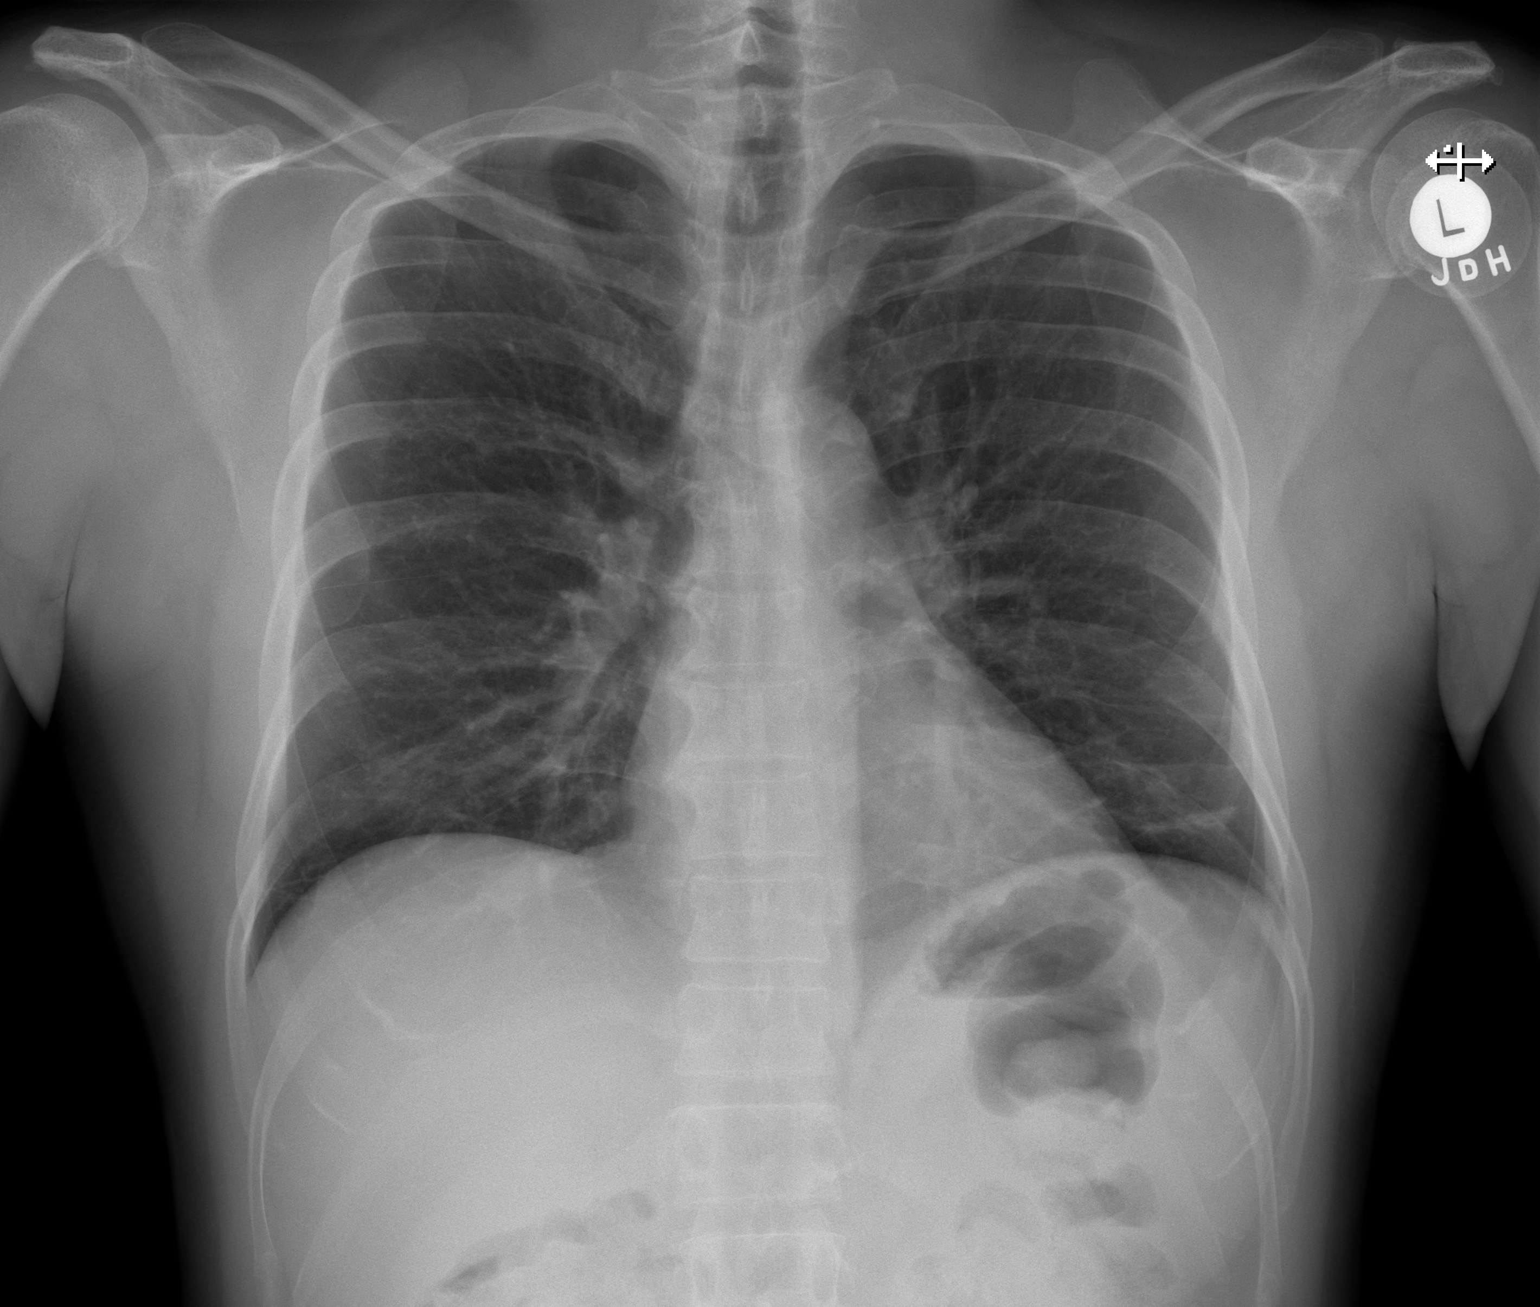

[w chest lat]
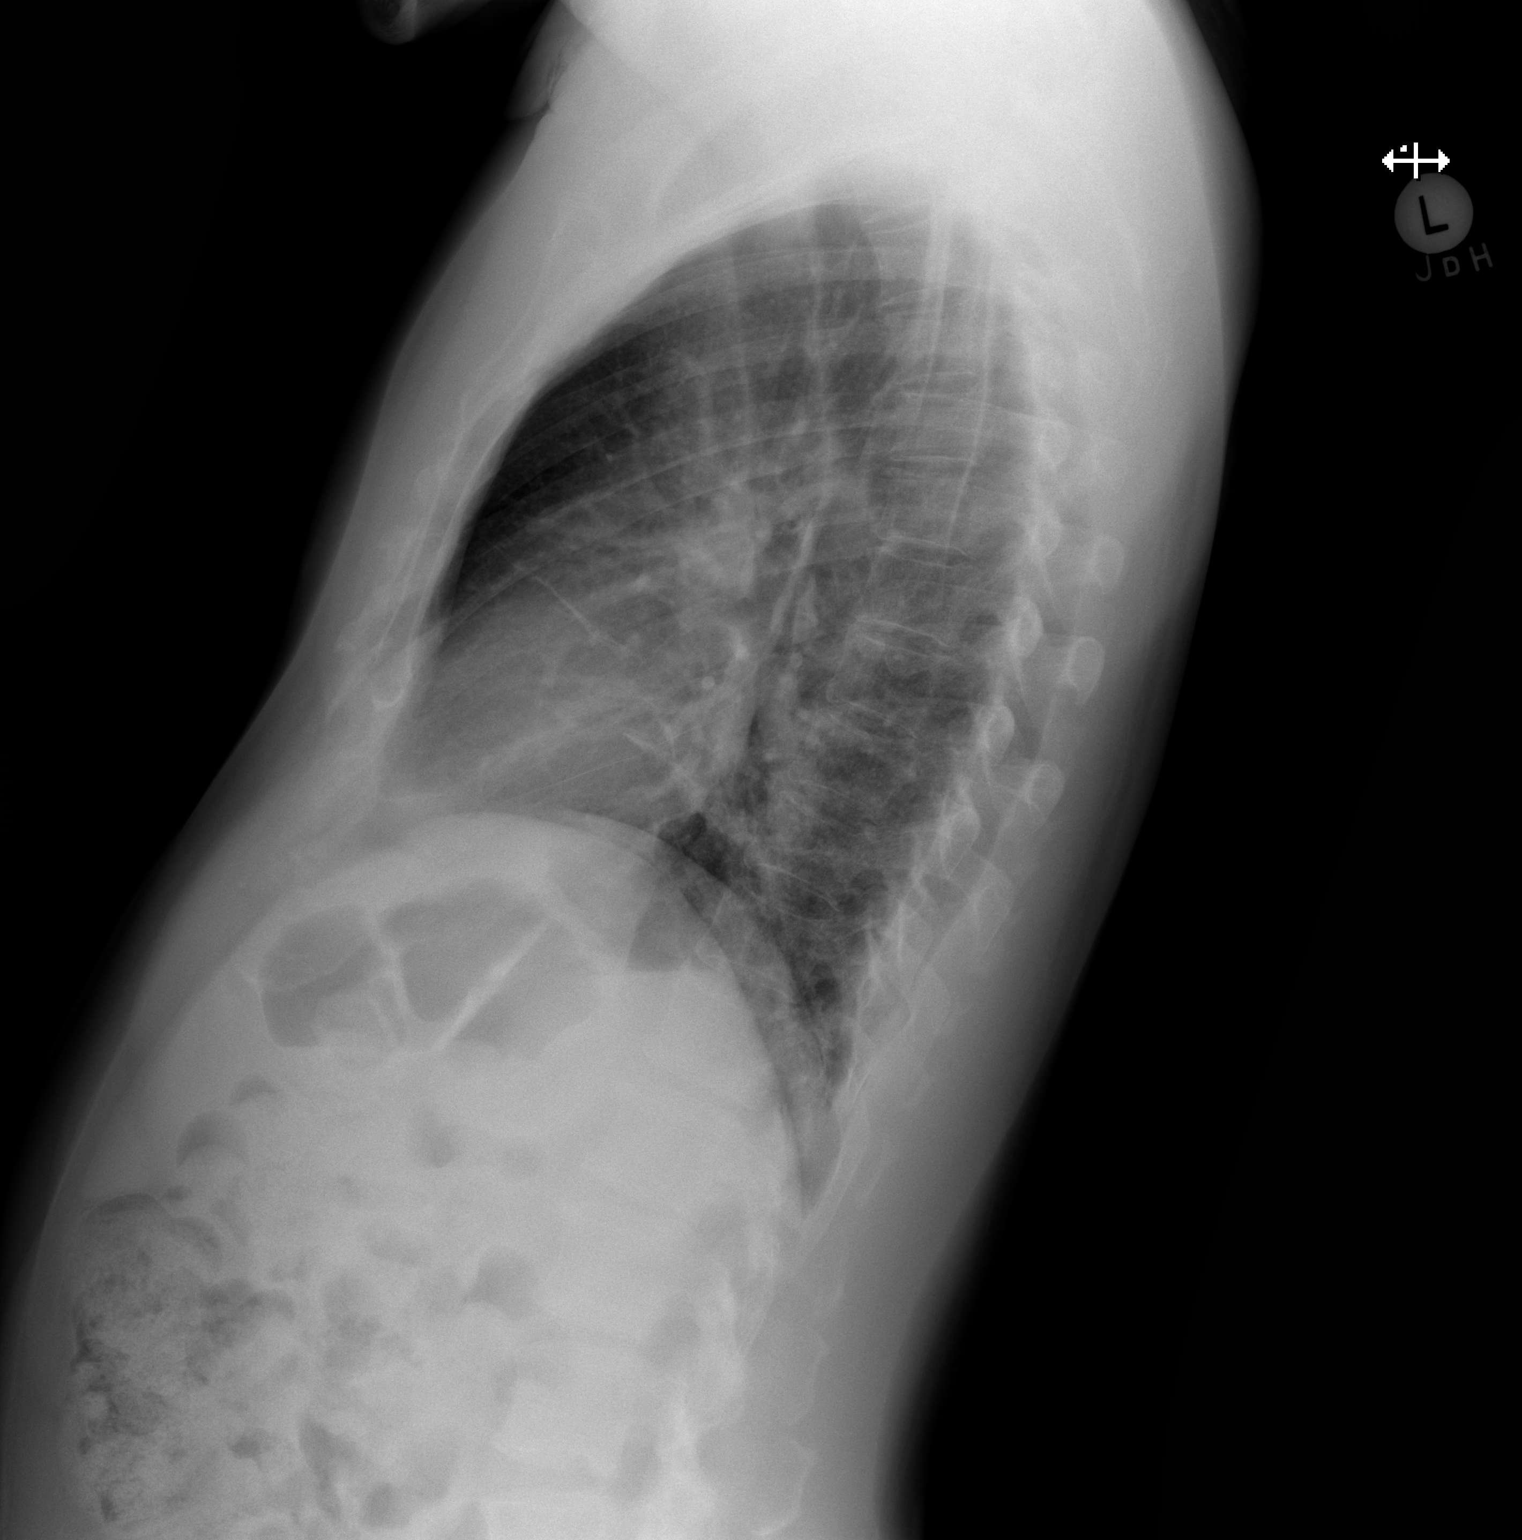

[2 of 2 positions shown; findings below may reference images not displayed]

FINDINGS: Heart size is normal. Mediastinal shadows are normal. The right lung
is clear. There are a few newly seen linear opacities at the left
lung base that could be scarring or mild atelectasis/atelectatic
pneumonia. No dense consolidation. No lobar collapse or effusion.
Bony structures are unremarkable.
IMPRESSION: Newly seen linear markings at the left lung base that could be
scarring, atelectasis or mild atelectatic pneumonia.

## 2021-02-24 ENCOUNTER — Other Ambulatory Visit: Payer: Self-pay

## 2021-02-24 ENCOUNTER — Ambulatory Visit
Admission: EM | Admit: 2021-02-24 | Discharge: 2021-02-24 | Disposition: A | Discharge status: Home / Self Care | Payer: BC Managed Care – PPO | Attending: Emergency Medicine | Admitting: Emergency Medicine

## 2021-02-24 DIAGNOSIS — J22 Unspecified acute lower respiratory infection: Secondary | ICD-10-CM | POA: Diagnosis not present

## 2021-02-24 MED ORDER — DM-GUAIFENESIN ER 30-600 MG PO TB12: 1.0000 | ORAL_TABLET | Freq: Two times a day (BID) | ORAL | 0 refills | Status: AC

## 2021-02-24 MED ORDER — FLUTICASONE PROPIONATE 50 MCG/ACT NA SUSP: 1.0000 | Freq: Every day | NASAL | 0 refills | Status: AC

## 2021-02-24 MED ORDER — BENZONATATE 200 MG PO CAPS: 200.0000 mg | ORAL_CAPSULE | Freq: Three times a day (TID) | ORAL | 0 refills | Status: AC | PRN

## 2021-02-24 MED ORDER — DOXYCYCLINE HYCLATE 100 MG PO CAPS: 100.0000 mg | ORAL_CAPSULE | Freq: Two times a day (BID) | ORAL | 0 refills | Status: AC

## 2021-02-24 NOTE — ED Provider Notes (Signed)
[facility]    CSN: [id number] Arrival date & time: 02/24/21  5852      History   Chief Complaint Chief Complaint  Patient presents with   Cough   Headache    HPI Kaniel Kidd is a 55 y.o. male history of hypertension, hyperlipidemia, presenting today for evaluation of cough and chest pain.  Reports 1 week of cough and congestion.  Recently has developed increased discomfort in his lower chest-which she describes as feeling more mucus/cough more productive recently, reports that this is actually improved today.  Reports history of bronchitis.  Denies history of asthma, tobacco use.  Denies any fevers chills body aches.  HPI  Past Medical History:  Diagnosis Date   Hypertension     Patient Active Problem List   Diagnosis Date Noted   Hyperlipidemia 08/04/2020   Immunization refused 08/04/2020   Chest pain 02/10/2016   Essential hypertension 08/22/2013    Past Surgical History:  Procedure Laterality Date   NO PAST SURGERIES         Home Medications    Prior to Admission medications   Medication Sig Start Date End Date Taking? Authorizing Provider  benzonatate (TESSALON) 200 MG capsule Take 1 capsule (200 mg total) by mouth 3 (three) times daily as needed for up to 7 days for cough. 02/24/21 03/03/21 Yes Percell Lamboy C, PA-C  dextromethorphan-guaiFENesin (MUCINEX DM) 30-600 MG 12hr tablet Take 1 tablet by mouth 2 (two) times daily. 02/24/21  Yes Joyell Emami C, PA-C  doxycycline (VIBRAMYCIN) 100 MG capsule Take 1 capsule (100 mg total) by mouth 2 (two) times daily for 7 days. 02/24/21 03/03/21 Yes Latrise Bowland C, PA-C  fluticasone (FLONASE) 50 MCG/ACT nasal spray Place 1-2 sprays into both nostrils daily. 02/24/21  Yes Naamah Boggess C, PA-C  amLODipine (NORVASC) 10 MG tablet TAKE 1 TABLET BY MOUTH EVERY DAY 09/01/20   Ronnald Nian, MD  aspirin 81 MG EC tablet Take 1 tablet (81 mg total) by mouth daily. 03/14/16   Shade Flood, MD   losartan-hydrochlorothiazide (HYZAAR) 100-12.5 MG tablet Take 1 tablet by mouth daily. 08/05/20   Ronnald Nian, MD    Family History Family History  Problem Relation Age of Onset   Diabetes Mother    Hypertension Mother     Social History Social History   Tobacco Use   Smoking status: Never   Smokeless tobacco: Never  Substance Use Topics   Alcohol use: No   Drug use: No     Allergies   Patient has no known allergies.   Review of Systems Review of Systems  Constitutional:  Negative for activity change, appetite change, chills, fatigue and fever.  HENT:  Positive for congestion and rhinorrhea. Negative for ear pain, sinus pressure, sore throat and trouble swallowing.   Eyes:  Negative for discharge and redness.  Respiratory:  Positive for cough. Negative for chest tightness and shortness of breath.   Cardiovascular:  Positive for chest pain.  Gastrointestinal:  Negative for abdominal pain, diarrhea, nausea and vomiting.  Musculoskeletal:  Negative for myalgias.  Skin:  Negative for rash.  Neurological:  Negative for dizziness, light-headedness and headaches.    Physical Exam Triage Vital Signs ED Triage Vitals  Enc Vitals Group     BP      Pulse      Resp      Temp      Temp src      SpO2      Weight  Height      Head Circumference      Peak Flow      Pain Score      Pain Loc      Pain Edu?      Excl. in GC?    No data found.  Updated Vital Signs BP (!) 153/95 (BP Location: Right Arm)   Pulse (!) 106   Temp 99.6 F (37.6 C) (Oral)   Resp 14   SpO2 95%   Visual Acuity Right Eye Distance:   Left Eye Distance:   Bilateral Distance:    Right Eye Near:   Left Eye Near:    Bilateral Near:     Physical Exam Vitals and nursing note reviewed.  Constitutional:      Appearance: Normal appearance. He is well-developed.     Comments: No acute distress  HENT:     Head: Normocephalic and atraumatic.     Ears:     Comments: Bilateral ears  without tenderness to palpation of external auricle, tragus and mastoid, EAC's without erythema or swelling, TM's with good bony landmarks and cone of light. Non erythematous.      Nose: Nose normal.     Mouth/Throat:     Comments: Oral mucosa pink and moist, no tonsillar enlargement or exudate. Posterior pharynx patent and nonerythematous, no uvula deviation or swelling. Normal phonation.  Eyes:     Conjunctiva/sclera: Conjunctivae normal.  Cardiovascular:     Rate and Rhythm: Normal rate.  Pulmonary:     Effort: Pulmonary effort is normal. No respiratory distress.     Comments: Breathing comfortably at rest, slightly diminished breath sounds throughout but no adventitious sounds auscultated, no wheezing  Abdominal:     General: There is no distension.  Musculoskeletal:        General: Normal range of motion.     Cervical back: Neck supple.  Skin:    General: Skin is warm and dry.  Neurological:     Mental Status: He is alert and oriented to person, place, and time.     UC Treatments / Results  Labs (all labs ordered are listed, but only abnormal results are displayed) Labs Reviewed - No data to display  EKG   Radiology No results found.  Procedures Procedures (including critical care time)  Medications Ordered in UC Medications - No data to display  Initial Impression / Assessment and Plan / UC Course  I have reviewed the triage vital signs and the nursing notes.  Pertinent labs & imaging results that were available during my care of the patient were reviewed by me and considered in my medical decision making (see chart for details).     URI symptoms greater than 1 week, continues with low-grade fever today in clinic, lungs overall clear to auscultation, has history of bronchitis, opting to go ahead and proceed with doxycycline given continued low-grade fevers, as well as continuing symptomatic and supportive care.  Recommendations provided.  Rest and  fluids.  Discussed strict return precautions. Patient verbalized understanding and is agreeable with plan.  Final Clinical Impressions(s) / UC Diagnoses   Final diagnoses:  Lower respiratory infection (e.g., bronchitis, pneumonia, pneumonitis, pulmonitis)     Discharge Instructions      Begin doxycycline twice daily for 1 week Tessalon/benzonatate every 8 hours as needed for cough Mucinex DM twice daily as needed for further relief of cough and congestion Flonase nasal spray 1 to 2 spray in each nostril daily Rest and fluids Follow-up if  symptoms not improving or worsening     ED Prescriptions     Medication Sig Dispense Auth. Provider   doxycycline (VIBRAMYCIN) 100 MG capsule Take 1 capsule (100 mg total) by mouth 2 (two) times daily for 7 days. 14 capsule Aizley Stenseth C, PA-C   benzonatate (TESSALON) 200 MG capsule Take 1 capsule (200 mg total) by mouth 3 (three) times daily as needed for up to 7 days for cough. 28 capsule Kaleia Longhi C, PA-C   fluticasone (FLONASE) 50 MCG/ACT nasal spray Place 1-2 sprays into both nostrils daily. 16 g Needham Biggins C, PA-C   dextromethorphan-guaiFENesin (MUCINEX DM) 30-600 MG 12hr tablet Take 1 tablet by mouth 2 (two) times daily. 20 tablet Johathan Province, [place] C, PA-C      PDMP not reviewed this encounter.   Lew Dawes, [place] 02/24/21 [phone]

## 2021-02-24 NOTE — ED Triage Notes (Signed)
Reports for about a week he believes his bronchitis is flaring up. Symptoms: headaches, chills, productive cough. Pt states it feels like his lungs are "filled".

## 2021-02-24 NOTE — Discharge Instructions (Signed)
Begin doxycycline twice daily for 1 week Tessalon/benzonatate every 8 hours as needed for cough Mucinex DM twice daily as needed for further relief of cough and congestion Flonase nasal spray 1 to 2 spray in each nostril daily Rest and fluids Follow-up if symptoms not improving or worsening

## 2021-05-21 ENCOUNTER — Other Ambulatory Visit: Payer: Self-pay | Admitting: Family Medicine

## 2021-05-21 DIAGNOSIS — I10 Essential (primary) hypertension: Secondary | ICD-10-CM

## 2021-06-04 ENCOUNTER — Other Ambulatory Visit: Payer: Self-pay | Admitting: Family Medicine

## 2021-06-04 DIAGNOSIS — I10 Essential (primary) hypertension: Secondary | ICD-10-CM

## 2021-06-06 ENCOUNTER — Other Ambulatory Visit: Payer: Self-pay | Admitting: Family Medicine

## 2021-06-06 DIAGNOSIS — I10 Essential (primary) hypertension: Secondary | ICD-10-CM

## 2021-06-10 ENCOUNTER — Telehealth: Payer: Self-pay

## 2021-06-10 NOTE — Telephone Encounter (Signed)
Pt. Called wanting to know if he could get a refill on his hctz we denied refills on it and he was not sure why. I explained to him that at his last visit you switched him to losartan/hctz so he did not need a refill on the hctz.

## 2021-08-09 ENCOUNTER — Encounter: Payer: BC Managed Care – PPO | Admitting: Family Medicine

## 2021-08-20 ENCOUNTER — Other Ambulatory Visit: Payer: Self-pay | Admitting: Family Medicine

## 2021-08-20 DIAGNOSIS — I10 Essential (primary) hypertension: Secondary | ICD-10-CM

## 2021-08-22 ENCOUNTER — Other Ambulatory Visit: Payer: Self-pay | Admitting: Family Medicine

## 2021-08-22 DIAGNOSIS — I10 Essential (primary) hypertension: Secondary | ICD-10-CM

## 2021-08-23 NOTE — Telephone Encounter (Signed)
Lvm advising pt of the need for an appointment. KH 

## 2021-11-26 ENCOUNTER — Other Ambulatory Visit: Payer: Self-pay | Admitting: Family Medicine

## 2021-11-26 DIAGNOSIS — I10 Essential (primary) hypertension: Secondary | ICD-10-CM

## 2022-01-02 ENCOUNTER — Other Ambulatory Visit: Payer: Self-pay | Admitting: Family Medicine

## 2022-01-02 DIAGNOSIS — I10 Essential (primary) hypertension: Secondary | ICD-10-CM

## 2022-01-04 ENCOUNTER — Other Ambulatory Visit: Payer: Self-pay | Admitting: Family Medicine

## 2022-01-04 DIAGNOSIS — I10 Essential (primary) hypertension: Secondary | ICD-10-CM

## 2022-01-05 NOTE — Telephone Encounter (Signed)
Can you call patient to schedule a visit. We can not refill this until a visit due to being over a year

## 2022-01-07 NOTE — Telephone Encounter (Signed)
Called pt. Had to LM to schedule an apt.  

## 2022-01-16 ENCOUNTER — Other Ambulatory Visit: Payer: Self-pay | Admitting: Family Medicine

## 2022-01-16 DIAGNOSIS — I10 Essential (primary) hypertension: Secondary | ICD-10-CM

## 2022-01-17 ENCOUNTER — Other Ambulatory Visit: Payer: Self-pay | Admitting: Family Medicine

## 2022-01-17 DIAGNOSIS — I10 Essential (primary) hypertension: Secondary | ICD-10-CM

## 2022-01-19 ENCOUNTER — Other Ambulatory Visit: Payer: Self-pay | Admitting: Family Medicine

## 2022-01-19 DIAGNOSIS — I10 Essential (primary) hypertension: Secondary | ICD-10-CM

## 2022-01-20 NOTE — Telephone Encounter (Signed)
Left message for pt to call back and schedule an appointment. Will deny

## 2022-01-20 NOTE — Telephone Encounter (Signed)
Left message for patient to call and schedule med check and I can fill for 30 days if needed once he schedules.

## 2022-02-14 ENCOUNTER — Other Ambulatory Visit: Payer: Self-pay | Admitting: Family Medicine

## 2022-02-14 DIAGNOSIS — I10 Essential (primary) hypertension: Secondary | ICD-10-CM

## 2022-02-15 ENCOUNTER — Other Ambulatory Visit: Payer: Self-pay | Admitting: Family Medicine

## 2022-02-15 DIAGNOSIS — I10 Essential (primary) hypertension: Secondary | ICD-10-CM

## 2022-02-15 NOTE — Telephone Encounter (Signed)
Called pt. LM to call and schedule an apt last one was 1 and half years ago.

## 2022-05-21 ENCOUNTER — Ambulatory Visit
Admission: EM | Admit: 2022-05-21 | Discharge: 2022-05-21 | Disposition: A | Discharge status: Home / Self Care | Payer: BC Managed Care – PPO | Attending: Emergency Medicine | Admitting: Emergency Medicine

## 2022-05-21 ENCOUNTER — Encounter: Payer: Self-pay | Admitting: Emergency Medicine

## 2022-05-21 DIAGNOSIS — J208 Acute bronchitis due to other specified organisms: Secondary | ICD-10-CM

## 2022-05-21 MED ORDER — PREDNISONE 10 MG PO TABS: 20.0000 mg | ORAL_TABLET | Freq: Every day | ORAL | 0 refills | Status: AC

## 2022-05-21 MED ORDER — BENZONATATE 100 MG PO CAPS: 100.0000 mg | ORAL_CAPSULE | Freq: Three times a day (TID) | ORAL | 0 refills | Status: AC

## 2022-05-21 MED ORDER — AMOXICILLIN-POT CLAVULANATE 875-125 MG PO TABS: 1.0000 | ORAL_TABLET | Freq: Two times a day (BID) | ORAL | 0 refills | Status: AC

## 2022-05-21 NOTE — ED Provider Notes (Signed)
[facility]   [id number] 05/21/22 Arrival Time: 1152   Chief Complaint  Patient presents with   Cough     SUBJECTIVE: History from: patient.  Juan Burton is a 56 y.o. male who presented to the urgent care for complaint of cough, congestion and fever for the past 1 week.  Denies sick exposure to COVID, flu or strep.  Denies recent travel.  Has not tried any OTC medication.  Denies any other pain or aggravating factors.  Denies previous symptoms in the past.   Denies  chills, fatigue, sinus pain, rhinorrhea, sore throat, SOB, wheezing, chest pain, nausea, changes in bowel or bladder habits.     ROS: As per HPI.  All other pertinent ROS negative.      Past Medical History:  Diagnosis Date   Hypertension    Past Surgical History:  Procedure Laterality Date   NO PAST SURGERIES     No Known Allergies No current facility-administered medications on file prior to encounter.   Current Outpatient Medications on File Prior to Encounter  Medication Sig Dispense Refill   amLODipine (NORVASC) 10 MG tablet TAKE 1 TABLET BY MOUTH EVERY DAY 30 tablet 0   losartan-hydrochlorothiazide (HYZAAR) 100-12.5 MG tablet TAKE 1 TABLET BY MOUTH EVERY DAY 30 tablet 0   aspirin 81 MG EC tablet Take 1 tablet (81 mg total) by mouth daily. 90 tablet 3   dextromethorphan-guaiFENesin (MUCINEX DM) 30-600 MG 12hr tablet Take 1 tablet by mouth 2 (two) times daily. 20 tablet 0   fluticasone (FLONASE) 50 MCG/ACT nasal spray Place 1-2 sprays into both nostrils daily. 16 g 0   Social History   Socioeconomic History   Marital status: Single    Spouse name: Not on file   Number of children: Not on file   Years of education: Not on file   Highest education level: Not on file  Occupational History   Not on file  Tobacco Use   Smoking status: Never   Smokeless tobacco: Never  Vaping Use   Vaping Use: Never used  Substance and Sexual Activity   Alcohol use: No   Drug use: No   Sexual  activity: Never    Birth control/protection: Abstinence  Other Topics Concern   Not on file  Social History Narrative   Not on file   Social Determinants of Health   Financial Resource Strain: Not on file  Food Insecurity: Not on file  Transportation Needs: Not on file  Physical Activity: Not on file  Stress: Not on file  Social Connections: Not on file  Intimate Partner Violence: Not on file   Family History  Problem Relation Age of Onset   Diabetes Mother    Hypertension Mother     OBJECTIVE:  Vitals:   05/21/22 1338 05/21/22 1339  BP: (!) 185/112   Pulse: (!) 110   Resp: 18   Temp: 98.3 F (36.8 C)   TempSrc: Oral   SpO2: 95%   Weight:  150 lb (68 kg)  Height:  5\' 6"  (1.676 m)     General appearance: alert; appears fatigued, but nontoxic; speaking in full sentences and tolerating own secretions HEENT: NCAT; Ears: EACs clear, TMs pearly gray; Eyes: PERRL.  EOM grossly intact. Sinuses: nontender; Nose: nares patent without rhinorrhea, Throat: oropharynx clear, tonsils non erythematous or enlarged, uvula midline  Neck: supple without LAD Lungs: unlabored respirations, symmetrical air entry; cough: marked; no respiratory distress; CTAB Heart: regular rate and rhythm.  Radial pulses 2+ symmetrical  bilaterally Skin: warm and dry Psychological: alert and cooperative; normal mood and affect  LABS:  No results found for this or any previous visit (from the past 24 hour(s)).   ASSESSMENT & PLAN:  1. Acute bronchitis due to other specified organisms     Meds ordered this encounter  Medications   amoxicillin-clavulanate (AUGMENTIN) 875-125 MG tablet    Sig: Take 1 tablet by mouth every 12 (twelve) hours.    Dispense:  14 tablet    Refill:  0   benzonatate (TESSALON) 100 MG capsule    Sig: Take 1 capsule (100 mg total) by mouth every 8 (eight) hours.    Dispense:  21 capsule    Refill:  0   predniSONE (DELTASONE) 10 MG tablet    Sig: Take 2 tablets (20 mg  total) by mouth daily for 5 days.    Dispense:  10 tablet    Refill:  0   Discharge instructions  Get plenty of rest and push fluids Augmentin was prescribed Prednisone was prescribed Tessalon was prescribed/take  them as directed Follow-up with PCP Take OTC tylenol as needed for fever, body aches, and/or chills Please return or go to the ED if you have any new or worsening symptoms such as persistent fever, worsening cough, shortness of breath, chest tightness, chest pain, turning blue, changes in mental status, etc...  Please follow up with PCP for recheck next week to ensure your symptoms are improving.     Reviewed expectations re: course of current medical issues. Questions answered. Outlined signs and symptoms indicating need for more acute intervention. Patient verbalized understanding. After Visit Summary given.          Durward Parcel, FNP 05/21/22 1359

## 2022-05-21 NOTE — ED Triage Notes (Signed)
Patient c/o productive cough, congestion, fever, worse at night when laying down x 1 week.  Patient denies any OTC cold meds.

## 2022-05-21 NOTE — Discharge Instructions (Signed)
Get plenty of rest and push fluids Augmentin was prescribed Prednisone was prescribed Tessalon was prescribed/take  them as directed Follow-up with PCP Take OTC tylenol as needed for fever, body aches, and/or chills Please return or go to the ED if you have any new or worsening symptoms such as persistent fever, worsening cough, shortness of breath, chest tightness, chest pain, turning blue, changes in mental status, etc..Marland Kitchen

## 2022-06-22 ENCOUNTER — Telehealth: Payer: Self-pay | Admitting: Family Medicine

## 2022-06-22 NOTE — Telephone Encounter (Signed)
Caller name: Claudell Wohler  On DPR?: Yes  Call back number: [phone] (mobile)  Provider they see: Denita Lung, MD  Reason for call: Pt would like Dr Carlota Raspberry to be his Dr again

## 2022-06-22 NOTE — Telephone Encounter (Signed)
Patient saw you for a while then did go somewhere else for a time but does not like where he went and would like to return as your patient please advise if you are willing to take this patient back on.

## 2022-06-23 NOTE — Telephone Encounter (Signed)
Called Juan Burton to inform him that Dr Carlota Raspberry would take him back and the earliest appt Feb 14 he wanted an earlier appt because him BP medication is running out.  He would like you to give him a call at [phone]

## 2022-06-23 NOTE — Telephone Encounter (Signed)
Pt is okay to be scheduled per Juan Burton

## 2022-06-23 NOTE — Telephone Encounter (Signed)
Yes I will see him. Thanks.

## 2022-06-23 NOTE — Telephone Encounter (Signed)
Called LM telling him he needs to reach out to current PCP for refills we cannot authorize a sooner date

## 2022-07-21 ENCOUNTER — Telehealth: Payer: Self-pay | Admitting: Family Medicine

## 2022-07-21 NOTE — Telephone Encounter (Signed)
[place] [employer] records request to [facility]
